# Patient Record
Sex: Male | Born: 1970 | Race: Black or African American | Hispanic: No | Marital: Married | Smoking: Former smoker
Health system: Southern US, Community
[De-identification: ages and names within clinical notes are randomized; demographics above are authoritative.]

## PROBLEM LIST (undated history)

## (undated) DIAGNOSIS — B019 Varicella without complication: Secondary | ICD-10-CM

## (undated) DIAGNOSIS — I10 Essential (primary) hypertension: Secondary | ICD-10-CM

## (undated) DIAGNOSIS — M1990 Unspecified osteoarthritis, unspecified site: Secondary | ICD-10-CM

## (undated) HISTORY — DX: Essential (primary) hypertension: I10

## (undated) HISTORY — PX: KNEE ARTHROSCOPY: SUR90

## (undated) HISTORY — PX: LEG SURGERY: SHX1003

## (undated) HISTORY — DX: Unspecified osteoarthritis, unspecified site: M19.90

## (undated) HISTORY — DX: Varicella without complication: B01.9

---

## 1997-08-05 ENCOUNTER — Emergency Department (HOSPITAL_COMMUNITY): Admission: EM | Admit: 1997-08-05 | Discharge: 1997-08-05 | Payer: Self-pay | Admitting: Emergency Medicine

## 1997-08-19 ENCOUNTER — Emergency Department (HOSPITAL_COMMUNITY): Admission: EM | Admit: 1997-08-19 | Discharge: 1997-08-20 | Payer: Self-pay | Admitting: Internal Medicine

## 2000-05-22 ENCOUNTER — Emergency Department (HOSPITAL_COMMUNITY): Admission: EM | Admit: 2000-05-22 | Discharge: 2000-05-22 | Payer: Self-pay | Admitting: Emergency Medicine

## 2000-06-17 ENCOUNTER — Emergency Department (HOSPITAL_COMMUNITY): Admission: EM | Admit: 2000-06-17 | Discharge: 2000-06-18 | Payer: Self-pay | Admitting: *Deleted

## 2000-06-18 ENCOUNTER — Encounter: Payer: Self-pay | Admitting: *Deleted

## 2000-07-23 ENCOUNTER — Encounter: Payer: Self-pay | Admitting: Emergency Medicine

## 2000-07-23 ENCOUNTER — Emergency Department (HOSPITAL_COMMUNITY): Admission: EM | Admit: 2000-07-23 | Discharge: 2000-07-23 | Payer: Self-pay | Admitting: Emergency Medicine

## 2000-08-06 ENCOUNTER — Encounter: Payer: Self-pay | Admitting: Orthopedic Surgery

## 2000-08-06 ENCOUNTER — Ambulatory Visit (HOSPITAL_COMMUNITY): Admission: RE | Admit: 2000-08-06 | Discharge: 2000-08-06 | Payer: Self-pay | Admitting: Orthopedic Surgery

## 2002-03-22 ENCOUNTER — Emergency Department (HOSPITAL_COMMUNITY): Admission: EM | Admit: 2002-03-22 | Discharge: 2002-03-22 | Payer: Self-pay | Admitting: *Deleted

## 2002-03-22 ENCOUNTER — Encounter: Payer: Self-pay | Admitting: Emergency Medicine

## 2002-11-14 ENCOUNTER — Emergency Department (HOSPITAL_COMMUNITY): Admission: EM | Admit: 2002-11-14 | Discharge: 2002-11-14 | Payer: Self-pay | Admitting: Emergency Medicine

## 2003-01-14 HISTORY — PX: LEG SURGERY: SHX1003

## 2003-08-05 ENCOUNTER — Emergency Department (HOSPITAL_COMMUNITY): Admission: EM | Admit: 2003-08-05 | Discharge: 2003-08-05 | Payer: Self-pay | Admitting: Emergency Medicine

## 2004-09-21 ENCOUNTER — Emergency Department (HOSPITAL_COMMUNITY): Admission: EM | Admit: 2004-09-21 | Discharge: 2004-09-21 | Payer: Self-pay | Admitting: Emergency Medicine

## 2005-03-28 ENCOUNTER — Emergency Department (HOSPITAL_COMMUNITY): Admission: EM | Admit: 2005-03-28 | Discharge: 2005-03-28 | Payer: Self-pay | Admitting: Emergency Medicine

## 2005-10-01 ENCOUNTER — Emergency Department (HOSPITAL_COMMUNITY): Admission: EM | Admit: 2005-10-01 | Discharge: 2005-10-01 | Payer: Self-pay | Admitting: Emergency Medicine

## 2005-10-14 ENCOUNTER — Emergency Department (HOSPITAL_COMMUNITY): Admission: EM | Admit: 2005-10-14 | Discharge: 2005-10-14 | Payer: Self-pay | Admitting: Emergency Medicine

## 2005-10-18 ENCOUNTER — Emergency Department (HOSPITAL_COMMUNITY): Admission: EM | Admit: 2005-10-18 | Discharge: 2005-10-18 | Payer: Self-pay | Admitting: Family Medicine

## 2005-10-19 ENCOUNTER — Emergency Department (HOSPITAL_COMMUNITY): Admission: EM | Admit: 2005-10-19 | Discharge: 2005-10-19 | Payer: Self-pay | Admitting: Family Medicine

## 2007-01-14 HISTORY — PX: KNEE ARTHROSCOPY: SUR90

## 2007-06-19 ENCOUNTER — Emergency Department (HOSPITAL_COMMUNITY): Admission: EM | Admit: 2007-06-19 | Discharge: 2007-06-19 | Payer: Self-pay | Admitting: Emergency Medicine

## 2007-08-02 ENCOUNTER — Emergency Department (HOSPITAL_COMMUNITY): Admission: EM | Admit: 2007-08-02 | Discharge: 2007-08-02 | Payer: Self-pay | Admitting: Emergency Medicine

## 2008-11-19 ENCOUNTER — Emergency Department (HOSPITAL_COMMUNITY): Admission: EM | Admit: 2008-11-19 | Discharge: 2008-11-19 | Payer: Self-pay | Admitting: Emergency Medicine

## 2009-12-04 ENCOUNTER — Emergency Department (HOSPITAL_COMMUNITY): Admission: EM | Admit: 2009-12-04 | Discharge: 2009-12-04 | Payer: Self-pay | Admitting: Family Medicine

## 2010-04-17 LAB — RAPID STREP SCREEN (MED CTR MEBANE ONLY): Streptococcus, Group A Screen (Direct): NEGATIVE

## 2011-04-08 ENCOUNTER — Encounter (HOSPITAL_COMMUNITY): Payer: Self-pay | Admitting: *Deleted

## 2011-04-08 ENCOUNTER — Emergency Department (INDEPENDENT_AMBULATORY_CARE_PROVIDER_SITE_OTHER)
Admission: EM | Admit: 2011-04-08 | Discharge: 2011-04-08 | Disposition: A | Discharge status: Home / Self Care | Payer: BC Managed Care – PPO | Source: Home / Self Care | Attending: Family Medicine | Admitting: Family Medicine

## 2011-04-08 DIAGNOSIS — M25569 Pain in unspecified knee: Secondary | ICD-10-CM

## 2011-04-08 DIAGNOSIS — M25562 Pain in left knee: Secondary | ICD-10-CM

## 2011-04-08 DIAGNOSIS — G8929 Other chronic pain: Secondary | ICD-10-CM

## 2011-04-08 LAB — GLUCOSE, CAPILLARY: Glucose-Capillary: 87 mg/dL (ref 70–99)

## 2011-04-08 MED ORDER — IBUPROFEN 600 MG PO TABS: 600.0000 mg | ORAL_TABLET | Freq: Three times a day (TID) | ORAL | Status: AC | PRN

## 2011-04-08 MED ORDER — TRAMADOL HCL 50 MG PO TABS: 50.0000 mg | ORAL_TABLET | Freq: Four times a day (QID) | ORAL | Status: AC | PRN

## 2011-04-08 NOTE — Discharge Instructions (Signed)
Take the prescribed medications as instructed for chronic knee pain. Combined tramadol with over-the-counter acetaminophen (Tylenol) 500 mg every 6 hours as needed for pain. Followup with your orthopedist as planned. You also need to find a primary care provider for screening of diseases like diabetes cholesterol etc. and to help monitor your efforts in losing weight.  Call number provided above to schedule appointment with a primary care doctor.

## 2011-04-08 NOTE — ED Provider Notes (Signed)
History     CSN: [id number]  Arrival date & time 04/08/11  1645   First MD Initiated Contact with Patient 04/08/11 1656      Chief Complaint  Patient presents with  . Knee Pain    (Consider location/radiation/quality/duration/timing/severity/associated sxs/prior treatment) HPI Comments: 41 year old obese male with history of chronic knee pain. Status post left knee arthroscopy years ago. Reports exacerbation of his knee pain in last 2 months but worse today after helping a friend to move furniture to a new apartment. Has not taken any medications for pain yet. As per his report he cannot see his prior orthopedist as he owes money to his office. Patient states he is in the process to being admitted to a new practice " [facility]". Denies recent falls or any recent blunt trauma to his knee.  Also has had some nasal congestion and felt nauseous with decreased appetite today. No fever or chills. No cough shortness of breath or chest pain. No abdominal pain, vomiting or diarrhea.   History reviewed. No pertinent past medical history.  Past Surgical History  Procedure Date  . Knee arthroscopy   . Leg surgery     Family History  Problem Relation Age of Onset  . Osteoarthritis Father     History  Substance Use Topics  . Smoking status: Never Smoker   . Smokeless tobacco: Not on file  . Alcohol Use: Yes     occasionally      Review of Systems  Constitutional: Negative for fever and chills.  HENT: Positive for congestion and rhinorrhea. Negative for sore throat and trouble swallowing.   Respiratory: Negative for cough, shortness of breath and wheezing.   Cardiovascular: Negative for chest pain and leg swelling.  Gastrointestinal: Positive for nausea. Negative for vomiting and diarrhea.  Musculoskeletal:       Left knee pain as HPI  Skin: Negative for rash.  Neurological: Negative for dizziness and headaches.    Allergies  Review of patient's allergies  indicates no known allergies.  Home Medications   Current Outpatient Rx  Name Route Sig Dispense Refill  . IBUPROFEN 600 MG PO TABS Oral Take 1 tablet (600 mg total) by mouth every 8 (eight) hours as needed for pain. 30 tablet 0  . TRAMADOL HCL 50 MG PO TABS Oral Take 1 tablet (50 mg total) by mouth every 6 (six) hours as needed for pain. 15 tablet 0    BP 146/93  Pulse 110  Temp(Src) 99.5 F (37.5 C) (Oral)  Resp 20  SpO2 97%  Physical Exam  Nursing note and vitals reviewed. Constitutional: He is oriented to person, place, and time. He appears well-developed and well-nourished. No distress.       obese  HENT:  Head: Normocephalic and atraumatic.  Mouth/Throat: Oropharynx is clear and moist. No oropharyngeal exudate.  Eyes: Conjunctivae are normal. Pupils are equal, round, and reactive to light. No scleral icterus.  Neck: Neck supple. No JVD present. No thyromegaly present.  Cardiovascular: Normal rate, regular rhythm and normal heart sounds.   Pulmonary/Chest: Breath sounds normal. No respiratory distress. He has no wheezes. He has no rales. He exhibits no tenderness.  Abdominal: Soft. There is no tenderness.  Musculoskeletal:       Left knee no effusion or deformity. Well healed lateral scar from prior arthroscopy. Crepitus with flexion and extension fair range of movement with reported pain. No patella dislocation. No laxity. Weight bearing but pain with walking.  Lymphadenopathy:  He has no cervical adenopathy.  Neurological: He is alert and oriented to person, place, and time.  Skin: No rash noted. He is not diaphoretic.    ED Course  Procedures (including critical care time)  Labs Reviewed - No data to display No results found.   1. Chronic pain of left knee       MDM  Exacerbation of chronic left knee pain. Prescribed ibuprofen and tramadol. Followup with orthopedist as planned. Patient capillary glucose normal. Mild upper respiratory infection symptoms.  Supportive care discussed with patient.        Sharin Grave, MD 04/10/11 1453

## 2011-04-08 NOTE — ED Notes (Signed)
Bilateral knee pain the past 2 months.  He denies injury.  Today the left knee is more painful than the right , especially while ambulating.

## 2016-07-02 ENCOUNTER — Encounter (HOSPITAL_COMMUNITY): Payer: Self-pay | Admitting: Emergency Medicine

## 2016-07-02 ENCOUNTER — Ambulatory Visit (HOSPITAL_COMMUNITY)
Admission: EM | Admit: 2016-07-02 | Discharge: 2016-07-02 | Disposition: A | Discharge status: Home / Self Care | Payer: Self-pay | Attending: Family Medicine | Admitting: Family Medicine

## 2016-07-02 DIAGNOSIS — R300 Dysuria: Secondary | ICD-10-CM

## 2016-07-02 LAB — POCT URINALYSIS DIP (DEVICE)
Bilirubin Urine: NEGATIVE
Glucose, UA: NEGATIVE mg/dL
KETONES UR: NEGATIVE mg/dL
NITRITE: NEGATIVE
PH: 6 (ref 5.0–8.0)
PROTEIN: 30 mg/dL — AB
Specific Gravity, Urine: 1.025 (ref 1.005–1.030)
UROBILINOGEN UA: 1 mg/dL (ref 0.0–1.0)

## 2016-07-02 MED ORDER — CIPROFLOXACIN HCL 500 MG PO TABS: 500.0000 mg | ORAL_TABLET | Freq: Two times a day (BID) | ORAL | 0 refills | Status: DC

## 2016-07-02 NOTE — ED Triage Notes (Addendum)
Pt reports burning with urination for five days and left lower abdominal pain.  He also reports pain with erection.  Pt is in a monogamous relationship with his wife and is not concerned for STD.

## 2016-07-02 NOTE — ED Provider Notes (Signed)
  [facility]    ASSESSMENT:  1. Dysuria     PLAN:  Meds ordered this encounter  Medications  . ciprofloxacin (CIPRO) 500 MG tablet    Sig: Take 1 tablet (500 mg total) by mouth 2 (two) times daily.    Dispense:  14 tablet    Refill:  0   Discussed possibility of early prostatitis or transient urethral injury. No STD testing desired. Monitor symptoms closely over the next 48-72 hours.  Await urine culture.  Outlined signs and symptoms indicating need for more acute intervention. Call or return to clinic if these symptoms worsen or fail to improve as anticipated. Patient verbalized understanding. After Visit Summary given.  SUBJECTIVE:  Marcus Suarez is a 46 y.o. male who complains of dysuria x 4-5 days ("stinging"), without flank pain, abdominal pain, fever, chills. No testicular pain or swelling. No injury. Initially with drop of blood at urethral meatus. Unsure if further blood. No penile discharge. Sexually active with wife only. No h/o STD. No rashes. Normal PO intake. Ambulatory. Also reports slight and similar pain with ejaculation (once). No h/o similar.  OBJECTIVE:  Vitals:   07/02/16 1007  BP: 131/89  Pulse: 67  Temp: 98.3 F (36.8 C)  TempSrc: Oral  SpO2: 98%   Appears well, in no apparent distress. Penis appears normal without rashes or bleeding. Testicles normal. Prostate with mild tenderness to palpation. Abdomen is soft without tenderness, guarding, mass, rebound or organomegaly. No CVA tenderness or inguinal adenopathy noted.  Labs Reviewed  POCT URINALYSIS DIP (DEVICE) - Abnormal; Notable for the following:       Result Value   Hgb urine dipstick MODERATE (*)    Protein, ur 30 (*)    Leukocytes, UA LARGE (*)    All other components within normal limits  URINE CULTURE      Mardella LaymanHagler, Raywood Wailes, MD 07/02/16 1058

## 2016-07-03 LAB — URINE CULTURE: CULTURE: NO GROWTH

## 2016-07-08 ENCOUNTER — Ambulatory Visit (HOSPITAL_COMMUNITY)
Admission: EM | Admit: 2016-07-08 | Discharge: 2016-07-08 | Disposition: A | Discharge status: Home / Self Care | Payer: Self-pay | Attending: Family Medicine | Admitting: Family Medicine

## 2016-07-08 ENCOUNTER — Encounter (HOSPITAL_COMMUNITY): Payer: Self-pay | Admitting: *Deleted

## 2016-07-08 DIAGNOSIS — R369 Urethral discharge, unspecified: Secondary | ICD-10-CM

## 2016-07-08 NOTE — ED Provider Notes (Signed)
  [facility]   [id number] 07/08/16 Arrival Time: 1039  ASSESSMENT & PLAN:  1. Bloody discharge from penis     No repeat U/A today. He doesn't have insurance until July. Recommend urology f/u. Information given. Will finish Cipro. Informed that urine culture from last visit without growth.  Reviewed expectations re: course of current medical issues. Questions answered. Outlined signs and symptoms indicating need for more acute intervention. Follow up here or in the Emergency Department if worsening. Patient verbalized understanding. After Visit Summary given.   SUBJECTIVE:  Marcus Suarez is a 46 y.o. male who presents for follow up. Since last visit symptoms have improved on Cipro. Stinging sensation with urination has resolved. Approx 48 hours ago though he reports erection and then seeing a drop of bright red blood before ejaculation. "Felt something funny right before." No penile discharge. No further bleeding seen. No abdominal or testicular pain. Urinating normally without visible hematuria.  ROS: As per HPI.   OBJECTIVE:  Vitals:   07/08/16 1113  BP: 132/78  Pulse: 78  Resp: 18  Temp: 98.6 F (37 C)  TempSrc: Oral  SpO2: 100%     General appearance: alert, cooperative, appears stated age and no distress Back: symmetric, no curvature. ROM normal. No CVA tenderness. GU: declines today Abdomen: soft, non-tender; bowel sounds normal; no masses,  no organomegaly; no guarding or rebound tenderness   No Known Allergies  PMHx, SurgHx, SocialHx, Medications, and Allergies were reviewed in the Visit Navigator and updated as appropriate.       Mardella LaymanHagler, Jamilette Suchocki, MD 07/08/16 1154

## 2016-07-08 NOTE — ED Triage Notes (Signed)
Pt   Was   Seen    6    Days   Ago     For   Poss      Std    He  Reports   Several  Days  Ago  He    Noticed    Some  Blood  In  His  Urine      During   Sex        Pt  Reports  He  Got  His  meds  Filled

## 2016-07-08 NOTE — Discharge Instructions (Signed)
Please schedule an appointment with urology for further evaluation.

## 2018-08-09 ENCOUNTER — Other Ambulatory Visit: Payer: Self-pay

## 2018-08-09 DIAGNOSIS — Z20822 Contact with and (suspected) exposure to covid-19: Secondary | ICD-10-CM

## 2018-08-11 LAB — NOVEL CORONAVIRUS, NAA: SARS-CoV-2, NAA: NOT DETECTED

## 2019-02-16 ENCOUNTER — Encounter: Payer: Self-pay | Admitting: Podiatry

## 2019-02-16 ENCOUNTER — Other Ambulatory Visit: Payer: Self-pay

## 2019-02-16 ENCOUNTER — Ambulatory Visit (INDEPENDENT_AMBULATORY_CARE_PROVIDER_SITE_OTHER): Payer: BC Managed Care – PPO

## 2019-02-16 ENCOUNTER — Ambulatory Visit (INDEPENDENT_AMBULATORY_CARE_PROVIDER_SITE_OTHER): Payer: BC Managed Care – PPO | Admitting: Podiatry

## 2019-02-16 VITALS — BP 168/91 | HR 85 | Temp 97.5°F | Resp 16

## 2019-02-16 DIAGNOSIS — M79671 Pain in right foot: Secondary | ICD-10-CM

## 2019-02-16 DIAGNOSIS — M76821 Posterior tibial tendinitis, right leg: Secondary | ICD-10-CM

## 2019-02-16 DIAGNOSIS — M7661 Achilles tendinitis, right leg: Secondary | ICD-10-CM

## 2019-02-16 MED ORDER — DICLOFENAC SODIUM 75 MG PO TBEC: 75.0000 mg | DELAYED_RELEASE_TABLET | Freq: Two times a day (BID) | ORAL | 2 refills | Status: DC

## 2019-02-16 NOTE — Progress Notes (Signed)
   Subjective:    Patient ID: Marcus Suarez, male    DOB: [birth year], 49 y.o.   MRN: [id number]  HPI    Review of Systems  All other systems reviewed and are negative.      Objective:   Physical Exam        Assessment & Plan:

## 2019-02-16 NOTE — Patient Instructions (Signed)

## 2019-02-17 NOTE — Progress Notes (Signed)
Subjective:   Patient ID: Marcus Suarez, male   DOB: 49 y.o.   MRN: [id number]   HPI Patient presents with a lot of pain in the back of his right heel and also in his right ankle stating it is been going on for several months and he has been trying to be active to keep his weight under control.  Patient states this has to curtailed his activity and patient is again trying to get on a weight loss program patient does not smoke likes to be active if possible   Review of Systems  All other systems reviewed and are negative.       Objective:  Physical Exam Vitals and nursing note reviewed.  Constitutional:      Appearance: He is well-developed.  Pulmonary:     Effort: Pulmonary effort is normal.  Musculoskeletal:        General: Normal range of motion.  Skin:    General: Skin is warm.  Neurological:     Mental Status: He is alert.     Neurovascular status was found to be intact muscle strength was found to be adequate range of motion was moderately restricted right secondary to discomfort.  Patient is found to have posterior medial pain Achilles tendon at the insertion into the calcaneus and is noted to have quite a bit of discomfort also in the posterior tibial tendon as it comes under the medial malleolus.  Patient does have moderate equinus condition noted and has good digital perfusion well oriented x3     Assessment:  Acute Achilles tendinitis right with also posterior tibial tendinitis right     Plan:  H&P reviewed both conditions x-rays and flatfoot deformity.  Today we will get a focus on the posterior heel and I did explain risk of rupture associated with injection and patient wants this done.  Today I went ahead did sterile prep of the medial side and carefully injected 3 mg dexamethasone 5 mg Xylocaine keeping away from the center lateral portion of the tendon and then applied air fracture walker to completely immobilize the foot lower leg and also the posterior tibial  tendon.  Patient will be seen back in approximately 4 weeks and was given all instructions at this time  X-rays indicate that there is flatness of the arch and moderate indications of arthritis no indications of stress fracture with posterior heel spur noted

## 2019-02-18 ENCOUNTER — Other Ambulatory Visit: Payer: Self-pay | Admitting: Podiatry

## 2019-02-18 DIAGNOSIS — M7661 Achilles tendinitis, right leg: Secondary | ICD-10-CM

## 2019-02-18 DIAGNOSIS — M76821 Posterior tibial tendinitis, right leg: Secondary | ICD-10-CM

## 2019-03-10 ENCOUNTER — Encounter: Payer: Self-pay | Admitting: Podiatry

## 2019-03-10 ENCOUNTER — Other Ambulatory Visit: Payer: Self-pay

## 2019-03-10 ENCOUNTER — Ambulatory Visit (INDEPENDENT_AMBULATORY_CARE_PROVIDER_SITE_OTHER): Payer: BC Managed Care – PPO | Admitting: Podiatry

## 2019-03-10 VITALS — Temp 97.8°F

## 2019-03-10 DIAGNOSIS — M722 Plantar fascial fibromatosis: Secondary | ICD-10-CM | POA: Diagnosis not present

## 2019-03-10 DIAGNOSIS — M7661 Achilles tendinitis, right leg: Secondary | ICD-10-CM

## 2019-03-11 NOTE — Progress Notes (Signed)
Subjective:   Patient ID: Marcus Suarez, male   DOB: 49 y.o.   MRN: [id number]   HPI Patient presents stating that he has been wearing the boot and it seems the back of the heel is quite a bit improved but still noticeable and has been having some problems also with the bottom of the heel that he has questions about.  States he seems to be improved but still notices discomfort and is been wearing the boot almost exclusively up till now   ROS      Objective:  Physical Exam  Neurovascular status intact with patient found to have diminished posterior pain of the heel with discomfort still noted upon deep palpation but overall improved with patient found to have plantar inflammation of the medial band of the tendon that is moderately tender where pressed     Assessment:  Achilles tendinitis which appears to be improved with combination of patient immobilization with moderate plantar fascial inflammation right     Plan:  Discussed both conditions separately and at this point for the plantar heel I did do sterile prep and injected the fascia 3 mg Kenalog 5 mg Xylocaine and for the posterior I have recommended continued immobilization with heel lift which was dispensed today with gradual increase in activity reduction of boot.  Patient will be seen back to reevaluate

## 2019-04-07 ENCOUNTER — Ambulatory Visit: Payer: BC Managed Care – PPO | Admitting: Podiatry

## 2019-04-28 ENCOUNTER — Other Ambulatory Visit: Payer: Self-pay

## 2019-04-28 ENCOUNTER — Encounter: Payer: Self-pay | Admitting: Podiatry

## 2019-04-28 ENCOUNTER — Ambulatory Visit (INDEPENDENT_AMBULATORY_CARE_PROVIDER_SITE_OTHER): Payer: BC Managed Care – PPO | Admitting: Podiatry

## 2019-04-28 VITALS — Temp 98.2°F

## 2019-04-28 DIAGNOSIS — M722 Plantar fascial fibromatosis: Secondary | ICD-10-CM

## 2019-04-28 DIAGNOSIS — M7661 Achilles tendinitis, right leg: Secondary | ICD-10-CM

## 2019-04-28 NOTE — Progress Notes (Signed)
Subjective:   Patient ID: Marcus Suarez, male   DOB: 49 y.o.   MRN: [id number]   HPI Patient presents stating my foot feels better with occasional zinger type pains in the back of my right heel   ROS      Objective:  Physical Exam  Neuro vascular status intact with plantar pain that has improved with mild to moderate posterior pain which is present but not as bad as previous     Assessment:  Achilles tendinitis right improved but painful with plantar pain improved     Plan:  H&P discussed continued anti-inflammatories physical therapy and boot usage on occasional basis.  Patient is discharged will be seen back as needed

## 2020-11-21 ENCOUNTER — Emergency Department (HOSPITAL_COMMUNITY): Payer: BC Managed Care – PPO | Admitting: Certified Registered Nurse Anesthetist

## 2020-11-21 ENCOUNTER — Encounter (HOSPITAL_COMMUNITY)
Admission: EM | Disposition: A | Discharge status: Home / Self Care | Payer: Self-pay | Source: Home / Self Care | Attending: Emergency Medicine

## 2020-11-21 ENCOUNTER — Observation Stay (HOSPITAL_COMMUNITY)
Admission: EM | Admit: 2020-11-21 | Discharge: 2020-11-22 | Disposition: A | Discharge status: Home / Self Care | Payer: BC Managed Care – PPO | Attending: Emergency Medicine | Admitting: Emergency Medicine

## 2020-11-21 ENCOUNTER — Other Ambulatory Visit: Payer: Self-pay

## 2020-11-21 ENCOUNTER — Emergency Department (HOSPITAL_COMMUNITY): Payer: BC Managed Care – PPO

## 2020-11-21 ENCOUNTER — Ambulatory Visit (INDEPENDENT_AMBULATORY_CARE_PROVIDER_SITE_OTHER)
Admission: EM | Admit: 2020-11-21 | Discharge: 2020-11-21 | Disposition: A | Discharge status: Home / Self Care | Payer: BC Managed Care – PPO | Source: Home / Self Care

## 2020-11-21 ENCOUNTER — Encounter (HOSPITAL_COMMUNITY): Payer: Self-pay

## 2020-11-21 ENCOUNTER — Ambulatory Visit (INDEPENDENT_AMBULATORY_CARE_PROVIDER_SITE_OTHER): Payer: BC Managed Care – PPO

## 2020-11-21 DIAGNOSIS — K5900 Constipation, unspecified: Secondary | ICD-10-CM | POA: Diagnosis not present

## 2020-11-21 DIAGNOSIS — R1012 Left upper quadrant pain: Secondary | ICD-10-CM | POA: Diagnosis not present

## 2020-11-21 DIAGNOSIS — Z6838 Body mass index (BMI) 38.0-38.9, adult: Secondary | ICD-10-CM | POA: Diagnosis not present

## 2020-11-21 DIAGNOSIS — R1032 Left lower quadrant pain: Secondary | ICD-10-CM | POA: Diagnosis not present

## 2020-11-21 DIAGNOSIS — F1290 Cannabis use, unspecified, uncomplicated: Secondary | ICD-10-CM | POA: Diagnosis not present

## 2020-11-21 DIAGNOSIS — R109 Unspecified abdominal pain: Secondary | ICD-10-CM | POA: Diagnosis present

## 2020-11-21 DIAGNOSIS — K3580 Unspecified acute appendicitis: Principal | ICD-10-CM | POA: Diagnosis present

## 2020-11-21 DIAGNOSIS — R1084 Generalized abdominal pain: Secondary | ICD-10-CM

## 2020-11-21 DIAGNOSIS — R1031 Right lower quadrant pain: Secondary | ICD-10-CM

## 2020-11-21 DIAGNOSIS — E669 Obesity, unspecified: Secondary | ICD-10-CM | POA: Diagnosis not present

## 2020-11-21 DIAGNOSIS — Z20822 Contact with and (suspected) exposure to covid-19: Secondary | ICD-10-CM | POA: Insufficient documentation

## 2020-11-21 DIAGNOSIS — R112 Nausea with vomiting, unspecified: Secondary | ICD-10-CM | POA: Insufficient documentation

## 2020-11-21 HISTORY — PX: LAPAROSCOPIC APPENDECTOMY: SHX408

## 2020-11-21 LAB — CBC WITH DIFFERENTIAL/PLATELET
Abs Immature Granulocytes: 0.12 10*3/uL — ABNORMAL HIGH (ref 0.00–0.07)
Abs Immature Granulocytes: 0.12 10*3/uL — ABNORMAL HIGH (ref 0.00–0.07)
Basophils Absolute: 0 10*3/uL (ref 0.0–0.1)
Basophils Absolute: 0 10*3/uL (ref 0.0–0.1)
Basophils Relative: 0 %
Basophils Relative: 0 %
Eosinophils Absolute: 0 10*3/uL (ref 0.0–0.5)
Eosinophils Absolute: 0 10*3/uL (ref 0.0–0.5)
Eosinophils Relative: 0 %
Eosinophils Relative: 0 %
HCT: 47.4 % (ref 39.0–52.0)
HCT: 45.1 % (ref 39.0–52.0)
Hemoglobin: 15.2 g/dL (ref 13.0–17.0)
Hemoglobin: 14.5 g/dL (ref 13.0–17.0)
Immature Granulocytes: 1 %
Immature Granulocytes: 1 %
Lymphocytes Relative: 6 %
Lymphocytes Relative: 6 %
Lymphs Abs: 1.1 10*3/uL (ref 0.7–4.0)
Lymphs Abs: 1.1 10*3/uL (ref 0.7–4.0)
MCH: 28 pg (ref 26.0–34.0)
MCH: 28 pg (ref 26.0–34.0)
MCHC: 32.1 g/dL (ref 30.0–36.0)
MCHC: 32.2 g/dL (ref 30.0–36.0)
MCV: 87.3 fL (ref 80.0–100.0)
MCV: 87.2 fL (ref 80.0–100.0)
Monocytes Absolute: 0.5 10*3/uL (ref 0.1–1.0)
Monocytes Absolute: 0.5 10*3/uL (ref 0.1–1.0)
Monocytes Relative: 3 %
Monocytes Relative: 3 %
Neutro Abs: 17.9 10*3/uL — ABNORMAL HIGH (ref 1.7–7.7)
Neutro Abs: 17.5 10*3/uL — ABNORMAL HIGH (ref 1.7–7.7)
Neutrophils Relative %: 90 %
Neutrophils Relative %: 90 %
Platelets: 259 10*3/uL (ref 150–400)
Platelets: 247 10*3/uL (ref 150–400)
RBC: 5.43 MIL/uL (ref 4.22–5.81)
RBC: 5.17 MIL/uL (ref 4.22–5.81)
RDW: 12.8 % (ref 11.5–15.5)
RDW: 12.9 % (ref 11.5–15.5)
WBC: 19.7 10*3/uL — ABNORMAL HIGH (ref 4.0–10.5)
WBC: 19.4 10*3/uL — ABNORMAL HIGH (ref 4.0–10.5)
nRBC: 0 % (ref 0.0–0.2)
nRBC: 0 % (ref 0.0–0.2)

## 2020-11-21 LAB — URINALYSIS, ROUTINE W REFLEX MICROSCOPIC
Bilirubin Urine: NEGATIVE
Glucose, UA: NEGATIVE mg/dL
Ketones, ur: NEGATIVE mg/dL
Leukocytes,Ua: NEGATIVE
Nitrite: NEGATIVE
Protein, ur: 30 mg/dL — AB
RBC / HPF: >50 RBC/hpf — ABNORMAL HIGH (ref 0–5)
Specific Gravity, Urine: >1.046 — ABNORMAL HIGH (ref 1.005–1.030)
pH: 7 (ref 5.0–8.0)

## 2020-11-21 LAB — COMPREHENSIVE METABOLIC PANEL
ALT: 35 U/L (ref 0–44)
ALT: 34 U/L (ref 0–44)
AST: 25 U/L (ref 15–41)
AST: 25 U/L (ref 15–41)
Albumin: 4.7 g/dL (ref 3.5–5.0)
Albumin: 4.8 g/dL (ref 3.5–5.0)
Alkaline Phosphatase: 85 U/L (ref 38–126)
Alkaline Phosphatase: 80 U/L (ref 38–126)
Anion gap: 11 (ref 5–15)
Anion gap: 8 (ref 5–15)
BUN: 5 mg/dL — ABNORMAL LOW (ref 6–20)
BUN: 6 mg/dL (ref 6–20)
CO2: 25 mmol/L (ref 22–32)
CO2: 26 mmol/L (ref 22–32)
Calcium: 9.2 mg/dL (ref 8.9–10.3)
Calcium: 9 mg/dL (ref 8.9–10.3)
Chloride: 98 mmol/L (ref 98–111)
Chloride: 101 mmol/L (ref 98–111)
Creatinine, Ser: 0.83 mg/dL (ref 0.61–1.24)
Creatinine, Ser: 0.71 mg/dL (ref 0.61–1.24)
GFR, Estimated: >60 mL/min (ref >60)
GFR, Estimated: >60 mL/min (ref >60)
Glucose, Bld: 132 mg/dL — ABNORMAL HIGH (ref 70–99)
Glucose, Bld: 137 mg/dL — ABNORMAL HIGH (ref 70–99)
Potassium: 3.6 mmol/L (ref 3.5–5.1)
Potassium: 3.8 mmol/L (ref 3.5–5.1)
Sodium: 134 mmol/L — ABNORMAL LOW (ref 135–145)
Sodium: 135 mmol/L (ref 135–145)
Total Bilirubin: 1.1 mg/dL (ref 0.3–1.2)
Total Bilirubin: 1.1 mg/dL (ref 0.3–1.2)
Total Protein: 8.4 g/dL — ABNORMAL HIGH (ref 6.5–8.1)
Total Protein: 8.4 g/dL — ABNORMAL HIGH (ref 6.5–8.1)

## 2020-11-21 LAB — RESP PANEL BY RT-PCR (FLU A&B, COVID) ARPGX2
Influenza A by PCR: NEGATIVE
Influenza B by PCR: NEGATIVE
SARS Coronavirus 2 by RT PCR: NEGATIVE

## 2020-11-21 LAB — LIPASE, BLOOD: Lipase: 24 U/L (ref 11–51)

## 2020-11-21 LAB — AMYLASE: Amylase: 28 U/L (ref 28–100)

## 2020-11-21 SURGERY — APPENDECTOMY, LAPAROSCOPIC
Anesthesia: General | Site: Abdomen

## 2020-11-21 MED ORDER — OXYCODONE HCL 5 MG PO TABS: 5.0000 mg | ORAL_TABLET | Freq: Once | ORAL | Status: DC | PRN

## 2020-11-21 MED ORDER — METHOCARBAMOL 500 MG PO TABS
500.0000 mg | ORAL_TABLET | Freq: Four times a day (QID) | ORAL | Status: DC | PRN
  Administered 2020-11-21: 500 mg via ORAL
  Filled 2020-11-21: qty 1

## 2020-11-21 MED ORDER — LIDOCAINE VISCOUS HCL 2 % MT SOLN
OROMUCOSAL | Status: AC
  Filled 2020-11-21: qty 15

## 2020-11-21 MED ORDER — LIDOCAINE VISCOUS HCL 2 % MT SOLN
15.0000 mL | Freq: Once | OROMUCOSAL | Status: AC
  Administered 2020-11-21: 15 mL via ORAL

## 2020-11-21 MED ORDER — ALUM & MAG HYDROXIDE-SIMETH 200-200-20 MG/5ML PO SUSP
30.0000 mL | Freq: Once | ORAL | Status: AC
  Administered 2020-11-21: 30 mL via ORAL

## 2020-11-21 MED ORDER — FENTANYL CITRATE PF 50 MCG/ML IJ SOSY: 25.0000 ug | PREFILLED_SYRINGE | INTRAMUSCULAR | Status: DC | PRN

## 2020-11-21 MED ORDER — SUCCINYLCHOLINE CHLORIDE 200 MG/10ML IV SOSY
PREFILLED_SYRINGE | INTRAVENOUS | Status: AC
  Filled 2020-11-21: qty 10

## 2020-11-21 MED ORDER — ACETAMINOPHEN 500 MG PO TABS
1000.0000 mg | ORAL_TABLET | Freq: Four times a day (QID) | ORAL | Status: DC
  Administered 2020-11-21 – 2020-11-22 (×2): 1000 mg via ORAL
  Filled 2020-11-21 (×2): qty 2

## 2020-11-21 MED ORDER — ROCURONIUM BROMIDE 10 MG/ML (PF) SYRINGE
PREFILLED_SYRINGE | INTRAVENOUS | Status: DC | PRN
  Administered 2020-11-21 (×2): 20 mg via INTRAVENOUS

## 2020-11-21 MED ORDER — ENOXAPARIN SODIUM 40 MG/0.4ML IJ SOSY
40.0000 mg | PREFILLED_SYRINGE | INTRAMUSCULAR | Status: DC
  Administered 2020-11-22: 40 mg via SUBCUTANEOUS
  Filled 2020-11-21: qty 0.4

## 2020-11-21 MED ORDER — OXYCODONE HCL 5 MG PO TABS: 5.0000 mg | ORAL_TABLET | ORAL | Status: DC | PRN

## 2020-11-21 MED ORDER — ONDANSETRON 4 MG PO TBDP: 4.0000 mg | ORAL_TABLET | Freq: Four times a day (QID) | ORAL | Status: DC | PRN

## 2020-11-21 MED ORDER — SODIUM CHLORIDE 0.9 % IV BOLUS
1000.0000 mL | Freq: Once | INTRAVENOUS | Status: AC
  Administered 2020-11-21: 1000 mL via INTRAVENOUS

## 2020-11-21 MED ORDER — ONDANSETRON HCL 4 MG/2ML IJ SOLN
INTRAMUSCULAR | Status: DC | PRN
  Administered 2020-11-21: 4 mg via INTRAVENOUS

## 2020-11-21 MED ORDER — MIDAZOLAM HCL 2 MG/2ML IJ SOLN
INTRAMUSCULAR | Status: AC
  Filled 2020-11-21: qty 2

## 2020-11-21 MED ORDER — BUPIVACAINE HCL (PF) 0.5 % IJ SOLN
INTRAMUSCULAR | Status: DC | PRN
  Administered 2020-11-21: 20 mL

## 2020-11-21 MED ORDER — PROPOFOL 10 MG/ML IV BOLUS
INTRAVENOUS | Status: DC | PRN
  Administered 2020-11-21: 200 mg via INTRAVENOUS

## 2020-11-21 MED ORDER — ONDANSETRON 4 MG PO TBDP
ORAL_TABLET | ORAL | Status: AC
  Filled 2020-11-21: qty 1

## 2020-11-21 MED ORDER — LIDOCAINE 2% (20 MG/ML) 5 ML SYRINGE
INTRAMUSCULAR | Status: DC | PRN
Start: 2020-11-21 — End: 2020-11-21
  Administered 2020-11-21: 60 mg via INTRAVENOUS

## 2020-11-21 MED ORDER — ALUM & MAG HYDROXIDE-SIMETH 200-200-20 MG/5ML PO SUSP
ORAL | Status: AC
  Filled 2020-11-21: qty 30

## 2020-11-21 MED ORDER — IOHEXOL 350 MG/ML SOLN
80.0000 mL | Freq: Once | INTRAVENOUS | Status: AC | PRN
  Administered 2020-11-21: 80 mL via INTRAVENOUS

## 2020-11-21 MED ORDER — DEXMEDETOMIDINE (PRECEDEX) IN NS 20 MCG/5ML (4 MCG/ML) IV SYRINGE
PREFILLED_SYRINGE | INTRAVENOUS | Status: AC
  Filled 2020-11-21: qty 5

## 2020-11-21 MED ORDER — SUGAMMADEX SODIUM 500 MG/5ML IV SOLN
INTRAVENOUS | Status: DC | PRN
  Administered 2020-11-21: 200 mg via INTRAVENOUS

## 2020-11-21 MED ORDER — DIPHENHYDRAMINE HCL 25 MG PO CAPS: 25.0000 mg | ORAL_CAPSULE | Freq: Four times a day (QID) | ORAL | Status: DC | PRN

## 2020-11-21 MED ORDER — ONDANSETRON HCL 4 MG/2ML IJ SOLN
4.0000 mg | Freq: Once | INTRAMUSCULAR | Status: AC
  Administered 2020-11-21: 4 mg via INTRAVENOUS
  Filled 2020-11-21: qty 2

## 2020-11-21 MED ORDER — PIPERACILLIN-TAZOBACTAM IN DEX 2-0.25 GM/50ML IV SOLN
4.5000 g | Freq: Once | INTRAVENOUS | Status: AC
  Administered 2020-11-21: 4.5 g via INTRAVENOUS
  Filled 2020-11-21 (×2): qty 100

## 2020-11-21 MED ORDER — ACETAMINOPHEN 10 MG/ML IV SOLN: 1000.0000 mg | Freq: Once | INTRAVENOUS | Status: DC | PRN

## 2020-11-21 MED ORDER — SUCCINYLCHOLINE CHLORIDE 200 MG/10ML IV SOSY
PREFILLED_SYRINGE | INTRAVENOUS | Status: DC | PRN
  Administered 2020-11-21: 120 mg via INTRAVENOUS

## 2020-11-21 MED ORDER — ONDANSETRON 4 MG PO TBDP
4.0000 mg | ORAL_TABLET | Freq: Once | ORAL | Status: AC
  Administered 2020-11-21: 4 mg via ORAL

## 2020-11-21 MED ORDER — DIPHENHYDRAMINE HCL 50 MG/ML IJ SOLN: 25.0000 mg | Freq: Four times a day (QID) | INTRAMUSCULAR | Status: DC | PRN

## 2020-11-21 MED ORDER — OXYCODONE HCL 5 MG/5ML PO SOLN: 5.0000 mg | Freq: Once | ORAL | Status: DC | PRN

## 2020-11-21 MED ORDER — LACTATED RINGERS IV SOLN
INTRAVENOUS | Status: AC | PRN
  Administered 2020-11-21: 1000 mL

## 2020-11-21 MED ORDER — MORPHINE SULFATE (PF) 4 MG/ML IV SOLN
4.0000 mg | Freq: Once | INTRAVENOUS | Status: AC
  Administered 2020-11-21: 4 mg via INTRAVENOUS
  Filled 2020-11-21: qty 1

## 2020-11-21 MED ORDER — POTASSIUM CHLORIDE IN NACL 20-0.9 MEQ/L-% IV SOLN: INTRAVENOUS | Status: DC

## 2020-11-21 MED ORDER — DEXMEDETOMIDINE (PRECEDEX) IN NS 20 MCG/5ML (4 MCG/ML) IV SYRINGE
PREFILLED_SYRINGE | INTRAVENOUS | Status: DC | PRN
  Administered 2020-11-21: 8 ug via INTRAVENOUS

## 2020-11-21 MED ORDER — FENTANYL CITRATE (PF) 250 MCG/5ML IJ SOLN
INTRAMUSCULAR | Status: DC | PRN
  Administered 2020-11-21: 50 ug via INTRAVENOUS
  Administered 2020-11-21: 150 ug via INTRAVENOUS

## 2020-11-21 MED ORDER — ONDANSETRON HCL 4 MG/2ML IJ SOLN: 4.0000 mg | Freq: Four times a day (QID) | INTRAMUSCULAR | Status: DC | PRN

## 2020-11-21 MED ORDER — FENTANYL CITRATE (PF) 250 MCG/5ML IJ SOLN
INTRAMUSCULAR | Status: AC
  Filled 2020-11-21: qty 5

## 2020-11-21 MED ORDER — BUPIVACAINE HCL (PF) 0.5 % IJ SOLN
INTRAMUSCULAR | Status: AC
  Filled 2020-11-21: qty 30

## 2020-11-21 MED ORDER — AMISULPRIDE (ANTIEMETIC) 5 MG/2ML IV SOLN: 10.0000 mg | Freq: Once | INTRAVENOUS | Status: DC | PRN

## 2020-11-21 MED ORDER — PROMETHAZINE HCL 25 MG/ML IJ SOLN: 6.2500 mg | INTRAMUSCULAR | Status: DC | PRN

## 2020-11-21 MED ORDER — SODIUM CHLORIDE 0.9 % IV SOLN: INTRAVENOUS | Status: DC

## 2020-11-21 MED ORDER — KETOROLAC TROMETHAMINE 30 MG/ML IJ SOLN: 30.0000 mg | Freq: Once | INTRAMUSCULAR | Status: DC

## 2020-11-21 MED ORDER — MIDAZOLAM HCL 5 MG/5ML IJ SOLN
INTRAMUSCULAR | Status: DC | PRN
  Administered 2020-11-21: 2 mg via INTRAVENOUS

## 2020-11-21 MED ORDER — LACTATED RINGERS IV SOLN: INTRAVENOUS | Status: DC | PRN

## 2020-11-21 MED ORDER — TRAMADOL HCL 50 MG PO TABS
50.0000 mg | ORAL_TABLET | Freq: Four times a day (QID) | ORAL | Status: DC | PRN
  Administered 2020-11-22: 50 mg via ORAL
  Filled 2020-11-21: qty 1

## 2020-11-21 MED ORDER — HYDROMORPHONE HCL 1 MG/ML IJ SOLN
1.0000 mg | INTRAMUSCULAR | Status: DC | PRN
  Administered 2020-11-22: 1 mg via INTRAVENOUS
  Filled 2020-11-21: qty 1

## 2020-11-21 MED ORDER — PHENYLEPHRINE 40 MCG/ML (10ML) SYRINGE FOR IV PUSH (FOR BLOOD PRESSURE SUPPORT)
PREFILLED_SYRINGE | INTRAVENOUS | Status: DC | PRN
  Administered 2020-11-21: 80 ug via INTRAVENOUS

## 2020-11-21 SURGICAL SUPPLY — 39 items
ADH SKN CLS APL DERMABOND .7 (GAUZE/BANDAGES/DRESSINGS) ×1
APL PRP STRL LF DISP 70% ISPRP (MISCELLANEOUS) ×1
APPLIER CLIP 5 13 M/L LIGAMAX5 (MISCELLANEOUS)
APR CLP MED LRG 5 ANG JAW (MISCELLANEOUS)
BAG COUNTER SPONGE SURGICOUNT (BAG) IMPLANT
BAG SPEC RTRVL LRG 6X4 10 (ENDOMECHANICALS) ×1
BAG SPNG CNTER NS LX DISP (BAG)
CHLORAPREP W/TINT 26 (MISCELLANEOUS) ×2 IMPLANT
CLIP APPLIE 5 13 M/L LIGAMAX5 (MISCELLANEOUS) IMPLANT
COVER SURGICAL LIGHT HANDLE (MISCELLANEOUS) ×2 IMPLANT
CUTTER FLEX LINEAR 45M (STAPLE) IMPLANT
DECANTER SPIKE VIAL GLASS SM (MISCELLANEOUS) ×2 IMPLANT
DERMABOND ADVANCED (GAUZE/BANDAGES/DRESSINGS) ×1
DERMABOND ADVANCED .7 DNX12 (GAUZE/BANDAGES/DRESSINGS) ×1 IMPLANT
DRAPE LAPAROSCOPIC ABDOMINAL (DRAPES) ×2 IMPLANT
ELECT COAG MONOPOLAR (ELECTROSURGICAL) ×2
ELECT REM PT RETURN 15FT ADLT (MISCELLANEOUS) ×2 IMPLANT
ELECTRODE COAG MONOPOLAR (ELECTROSURGICAL) ×1 IMPLANT
GLOVE SURG ENC MOIS LTX SZ7.5 (GLOVE) ×2 IMPLANT
GOWN STRL REUS W/TWL XL LVL3 (GOWN DISPOSABLE) ×4 IMPLANT
IRRIG SUCT STRYKERFLOW 2 WTIP (MISCELLANEOUS) ×2
IRRIGATION SUCT STRKRFLW 2 WTP (MISCELLANEOUS) ×1 IMPLANT
KIT BASIN OR (CUSTOM PROCEDURE TRAY) ×2 IMPLANT
KIT TURNOVER KIT A (KITS) IMPLANT
PENCIL SMOKE EVACUATOR (MISCELLANEOUS) IMPLANT
POUCH SPECIMEN RETRIEVAL 10MM (ENDOMECHANICALS) ×2 IMPLANT
RELOAD 45 VASCULAR/THIN (ENDOMECHANICALS) IMPLANT
RELOAD STAPLE 45 3.5 BLU ETS (ENDOMECHANICALS) IMPLANT
RELOAD STAPLE TA45 3.5 REG BLU (ENDOMECHANICALS) ×2 IMPLANT
SET TUBE SMOKE EVAC HIGH FLOW (TUBING) ×2 IMPLANT
SHEARS HARMONIC ACE PLUS 36CM (ENDOMECHANICALS) ×2 IMPLANT
SLEEVE XCEL OPT CAN 5 100 (ENDOMECHANICALS) ×2 IMPLANT
SUT MNCRL AB 4-0 PS2 18 (SUTURE) ×2 IMPLANT
TOWEL OR 17X26 10 PK STRL BLUE (TOWEL DISPOSABLE) ×2 IMPLANT
TOWEL OR NON WOVEN STRL DISP B (DISPOSABLE) ×2 IMPLANT
TRAY FOLEY MTR SLVR 16FR STAT (SET/KITS/TRAYS/PACK) ×1 IMPLANT
TRAY LAPAROSCOPIC (CUSTOM PROCEDURE TRAY) ×2 IMPLANT
TROCAR BLADELESS OPT 5 100 (ENDOMECHANICALS) ×2 IMPLANT
TROCAR XCEL BLUNT TIP 100MML (ENDOMECHANICALS) ×2 IMPLANT

## 2020-11-21 NOTE — ED Provider Notes (Signed)
Emergency Medicine Provider Triage Evaluation Note  Marcus Suarez , a 50 y.o. male  was evaluated in triage.  Pt complains of abdominal pain.  Began yesterday.  Was seen at urgent care.  Had elevated white count, sent here for further evaluation.  Pain located generalized mid abdomen.  No chest pain, shortness of breath, back pain, urinary complaints, diarrhea or emesis.  He did eat [place] food prior to his pain starting.  Rates his pain a 10/10.  Review of Systems  Positive: Abdominal pain Negative: Fever, emesis, diarrhea  Physical Exam  BP (!) 194/86 (BP Location: Right Wrist)   Pulse 76   Temp 98.1 F (36.7 C) (Oral)   Resp 20   Ht 6' 2.5" (1.892 m)   Wt 136.1 kg   SpO2 100%   BMI 38.00 kg/m  Gen:   Awake, no distress   Resp:  Normal effort  ABD:  Nontender, difficult exam due to body habitus MSK:   Moves extremities without difficulty  Other:    Medical Decision Making  Medically screening exam initiated at 1:17 PM.  Appropriate orders placed.  CONSTANTIN HILLERY was informed that the remainder of the evaluation will be completed by another provider, this initial triage assessment does not replace that evaluation, and the importance of remaining in the ED until their evaluation is complete.  Abdominal pain  Reviewed labs from urgent care, WBC at 19  Plan on imaging   Phill Steck A, PA-C 11/21/20 1320    Horton, Clabe Seal, DO 11/21/20 1436

## 2020-11-21 NOTE — ED Triage Notes (Signed)
Pt presents with c/o abdominal pain. Pt reports he went to UC and was sent here for a CT.

## 2020-11-21 NOTE — Anesthesia Preprocedure Evaluation (Addendum)
Anesthesia Evaluation  Patient identified by MRN, date of birth, ID band Patient awake    Reviewed: Allergy & Precautions, NPO status , Patient's Chart, lab work & pertinent test results  Airway Mallampati: II  TM Distance: >3 FB Neck ROM: Full    Dental  (+) Edentulous Upper, Missing, Loose,    Pulmonary neg pulmonary ROS,    Pulmonary exam normal breath sounds clear to auscultation       Cardiovascular negative cardio ROS Normal cardiovascular exam Rhythm:Regular Rate:Normal  ECG: NSR, rate 71   Neuro/Psych negative neurological ROS  negative psych ROS   GI/Hepatic negative GI ROS, (+)     substance abuse  marijuana use,   Endo/Other  negative endocrine ROS  Renal/GU negative Renal ROS     Musculoskeletal negative musculoskeletal ROS (+)   Abdominal (+) + obese,   Peds  Hematology negative hematology ROS (+)   Anesthesia Other Findings ACUTE APPENDICITIS  Reproductive/Obstetrics                            Anesthesia Physical Anesthesia Plan  ASA: 2 and emergent  Anesthesia Plan: General   Post-op Pain Management:    Induction: Intravenous  PONV Risk Score and Plan: 3 and Ondansetron, Dexamethasone, Midazolam and Treatment may vary due to age or medical condition  Airway Management Planned: Oral ETT  Additional Equipment:   Intra-op Plan:   Post-operative Plan: Extubation in OR  Informed Consent: I have reviewed the patients History and Physical, chart, labs and discussed the procedure including the risks, benefits and alternatives for the proposed anesthesia with the patient or authorized representative who has indicated his/her understanding and acceptance.     Dental advisory given  Plan Discussed with: CRNA  Anesthesia Plan Comments:        Anesthesia Quick Evaluation

## 2020-11-21 NOTE — ED Provider Notes (Signed)
[facility]    CSN: [id number] Arrival date & time: 11/21/20  0859      History   Chief Complaint Chief Complaint  Patient presents with   Abdominal Pain    HPI Marcus Suarez is a 50 y.o. male.   HPI  Abdominal Pain: Patient reports that since last night he has had abdominal pain.  Symptoms started after he ate at a [employer].  He states that he had 2 tacos at [employer] and took an ACP to go which he ate most of.  He states that his pain started off his epigastric in nature and then trended to the left side and now has trended downward.  He reports that the first time he vomited he self-inflicted as he thought that vomiting up the food would help relieve his pain.  He has vomited 2 times since then.  Vomit appears to be last meal and does not appear bloody or coffee like in nature.  He denies any diarrhea.  Last bowel movement was yesterday but he does think that he has had some recent constipation.  He denies any fever, current chest pain, jaundice.  He has tried Pepto-Bismol for symptoms without much relief.  History reviewed. No pertinent past medical history.  There are no problems to display for this patient.   Past Surgical History:  Procedure Laterality Date   KNEE ARTHROSCOPY     LEG SURGERY         Home Medications    Prior to Admission medications   Medication Sig Start Date End Date Taking? Authorizing Provider  diclofenac (VOLTAREN) 75 MG EC tablet Take 1 tablet (75 mg total) by mouth 2 (two) times daily. 02/16/19   Lenn Sink, DPM    Family History Family History  Problem Relation Age of Onset   Osteoarthritis Father     Social History Social History   Tobacco Use   Smoking status: Never   Smokeless tobacco: Never  Substance Use Topics   Alcohol use: Yes    Comment: occasionally   Drug use: Yes    Types: Marijuana     Allergies   Patient has no known allergies.   Review of Systems Review of Systems  As  stated above in HPI Physical Exam Triage Vital Signs ED Triage Vitals  Enc Vitals Group     BP 11/21/20 1105 (!) 181/96     Pulse Rate 11/21/20 1105 76     Resp 11/21/20 1105 (!) 24     Temp 11/21/20 1105 98.2 F (36.8 C)     Temp Source 11/21/20 1105 Oral     SpO2 11/21/20 1105 100 %     Weight --      Height --      Head Circumference --      Peak Flow --      Pain Score 11/21/20 1059 10     Pain Loc --      Pain Edu? --      Excl. in GC? --    No data found.  Updated Vital Signs BP (!) 181/96 (BP Location: Left Arm)   Pulse 76   Temp 98.2 F (36.8 C) (Oral)   Resp (!) 24   SpO2 100%   Physical Exam Vitals and nursing note reviewed.  Constitutional:      General: He is not in acute distress.    Appearance: He is well-developed. He is obese. He is not ill-appearing, toxic-appearing or diaphoretic.  HENT:     Head: Normocephalic.  Eyes:     General: No scleral icterus.    Extraocular Movements: Extraocular movements intact.     Pupils: Pupils are equal, round, and reactive to light.  Cardiovascular:     Rate and Rhythm: Normal rate and regular rhythm.     Heart sounds: Normal heart sounds.  Pulmonary:     Effort: Pulmonary effort is normal.     Breath sounds: Normal breath sounds.  Abdominal:     General: Abdomen is flat. Bowel sounds are normal.     Palpations: Abdomen is soft. There is no hepatomegaly, splenomegaly or mass.     Tenderness: There is generalized abdominal tenderness and tenderness in the right lower quadrant, epigastric area, left upper quadrant and left lower quadrant. There is guarding. There is no rebound. Negative signs include Murphy's sign, McBurney's sign, psoas sign and obturator sign.     Hernia: No hernia is present.  Skin:    General: Skin is warm.     Coloration: Skin is not cyanotic or jaundiced.  Neurological:     Mental Status: He is alert and oriented to person, place, and time.     UC Treatments / Results  Labs (all labs  ordered are listed, but only abnormal results are displayed) Labs Reviewed  CBC WITH DIFFERENTIAL/PLATELET  COMPREHENSIVE METABOLIC PANEL  LIPASE, BLOOD  AMYLASE    EKG   Radiology No results found.  Procedures Procedures (including critical care time)  Medications Ordered in UC Medications  ondansetron (ZOFRAN-ODT) disintegrating tablet 4 mg (has no administration in time range)  alum & mag hydroxide-simeth (MAALOX/MYLANTA) 200-200-20 MG/5ML suspension 30 mL (has no administration in time range)    And  lidocaine (XYLOCAINE) 2 % viscous mouth solution 15 mL (has no administration in time range)    Initial Impression / Assessment and Plan / UC Course  I have reviewed the triage vital signs and the nursing notes.  Pertinent labs & imaging results that were available during my care of the patient were reviewed by me and considered in my medical decision making (see chart for details).     New. EKG reassuring, Labs and KUB pending.   UPDATE: CBC shows significantly elevated WBC count- high likelihood of him having a surgical abdomen- he therefore will need CT imaging to rule out diverticulitis vs pancreatitis vs obstruction vs appendicitis. Discussed my concerns with patient and have recommended that he go to the ER. He elects private vehicle transfer to ED. He will ne NPO which we discussed.  Final Clinical Impressions(s) / UC Diagnoses   Final diagnoses:  None   Discharge Instructions   None    ED Prescriptions   None    PDMP not reviewed this encounter.   Rushie Chestnut, [place] 11/21/20 1242

## 2020-11-21 NOTE — ED Triage Notes (Signed)
States he started epigastric ain and pain moved to the left upper quadrant around 3 am today. States he vomited 3 time this morning. Sattes he felt a mild chest pain

## 2020-11-21 NOTE — Op Note (Signed)
Appendectomy, Lap, Procedure Note  Indications: The patient presented with a history of right-sided abdominal pain. A CT revealed findings consistent with acute appendicitis.  Pre-operative Diagnosis: acute appendicitis  Post-operative Diagnosis: Same  Surgeon: Abigail Miyamoto   Assistants: 0  Anesthesia: General endotracheal anesthesia  ASA Class: 2  Procedure Details  The patient was seen again in the Holding Room. The risks, benefits, complications, treatment options, and expected outcomes were discussed with the patient and/or family. The possibilities of reaction to medication, perforation of viscus, bleeding, recurrent infection, finding a normal appendix, the need for additional procedures, failure to diagnose a condition, and creating a complication requiring transfusion or operation were discussed. There was concurrence with the proposed plan and informed consent was obtained. The site of surgery was properly noted. The patient was taken to Operating Room, identified as Marcus Suarez and the procedure verified as Appendectomy. A Time Out was held and the above information confirmed.  The patient was placed in the supine position and general anesthesia was induced, along with placement of orogastric tube, Venodyne boots, and a Foley catheter. The abdomen was prepped and draped in a sterile fashion. A one centimeter infraumbilical incision was made.  The  midline fascia was incised with a #15 blade.  A Kelly clamp was used to confirm entrance into the peritoneal cavity.  A pursestring suture was passed around the incision with a 0 Vicryl.  The Hasson was introduced into the abdomen and the tails of the suture were used to hold the Hasson in place.   The pneumoperitoneum was then established to steady pressure of 15 mmHg.  Additional 5 mm cannulas then placed in the left lower quadrant of the abdomen and the right upper quadrant under direct visualization. A careful evaluation of the  entire abdomen was carried out. The patient was placed in Trendelenburg and left lateral decubitus position. The small intestines were retracted in the cephalad and left lateral direction away from the pelvis and right lower quadrant. The patient was found to have an enlarged and inflamed appendix that was extending into the pelvis. There was no evidence of perforation.  The appendix was carefully dissected. The appendix was was skeletonized with the harmonic scalpel.   The appendix was divided at its base using an endo-GIA stapler. Minimal appendiceal stump was left in place. There was no evidence of bleeding, leakage, or complication after division of the appendix. Irrigation was also performed and irrigate suctioned from the abdomen as well.  The umbilical port site was closed with the purse string suture. There was no residual palpable fascial defect.  The trocar site skin wounds were closed with 4-0 Monocryl.  Instrument, sponge, and needle counts were correct at the conclusion of the case.   Findings: The appendix was found to be inflamed. There were not signs of necrosis.  There was not perforation. There was not abscess formation.  Estimated Blood Loss:  Minimal                 Complications:  None; patient tolerated the procedure well.         Disposition: PACU - hemodynamically stable.         Condition: stable

## 2020-11-21 NOTE — H&P (Signed)
CC: Abdominal pain   HPI: Marcus Suarez is an 50 y.o. male who presented to the emergency department with abdominal pain.  The pain started yesterday and was general throughout the entire abdomen.  He was seen in the urgent care and found of elevated white blood count.  He then presented to the emergency department.  He has had nausea and vomiting.  He describes the pain as sharp and severe.  It is now in the right lower quadrant.  He is otherwise without complaints.  He underwent a CT scan showing findings consistent with acute appendicitis.  He has no cardiopulmonary complaints.  He denies fevers or chills.  History reviewed. No pertinent past medical history.  Past Surgical History:  Procedure Laterality Date   KNEE ARTHROSCOPY     LEG SURGERY      Family History  Problem Relation Age of Onset   Osteoarthritis Father     Social:  reports that he has never smoked. He has never used smokeless tobacco. He reports current alcohol use. He reports current drug use. Drug: Marijuana.  Allergies: No Known Allergies  Medications: I have reviewed the patient's current medications.  Results for orders placed or performed during the hospital encounter of 11/21/20 (from the past 48 hour(s))  CBC with Differential     Status: Abnormal (Preliminary result)   Collection Time: 11/21/20  1:26 PM  Result Value Ref Range   WBC 19.4 (H) 4.0 - 10.5 K/uL   RBC 5.17 4.22 - 5.81 MIL/uL   Hemoglobin 14.5 13.0 - 17.0 g/dL   HCT 45.1 39.0 - 52.0 %   MCV 87.2 80.0 - 100.0 fL   MCH 28.0 26.0 - 34.0 pg   MCHC 32.2 30.0 - 36.0 g/dL   RDW 12.9 11.5 - 15.5 %   Platelets 247 150 - 400 K/uL   nRBC 0.0 0.0 - 0.2 %    Comment: Performed at [facility], [place] [address], [place], [place] [address]   Neutrophils Relative % PENDING %   Neutro Abs PENDING 1.7 - 7.7 K/uL   Band Neutrophils PENDING %   Lymphocytes Relative PENDING %   Lymphs Abs PENDING 0.7 - 4.0 K/uL   Monocytes Relative  PENDING %   Monocytes Absolute PENDING 0.1 - 1.0 K/uL   Eosinophils Relative PENDING %   Eosinophils Absolute PENDING 0.0 - 0.5 K/uL   Basophils Relative PENDING %   Basophils Absolute PENDING 0.0 - 0.1 K/uL   WBC Morphology PENDING    RBC Morphology PENDING    Smear Review PENDING    Other PENDING %   nRBC PENDING 0 /100 WBC   Metamyelocytes Relative PENDING %   Myelocytes PENDING %   Promyelocytes Relative PENDING %   Blasts PENDING %   Immature Granulocytes PENDING %   Abs Immature Granulocytes PENDING 0.00 - 0.07 K/uL  Comprehensive metabolic panel     Status: Abnormal   Collection Time: 11/21/20  1:26 PM  Result Value Ref Range   Sodium 135 135 - 145 mmol/L   Potassium 3.8 3.5 - 5.1 mmol/L   Chloride 101 98 - 111 mmol/L   CO2 26 22 - 32 mmol/L   Glucose, Bld 137 (H) 70 - 99 mg/dL    Comment: Glucose reference range applies only to samples taken after fasting for at least 8 hours.   BUN 6 6 - 20 mg/dL   Creatinine, Ser 0.71 0.61 - 1.24 mg/dL   Calcium 9.0 8.9 -  10.3 mg/dL   Total Protein 8.4 (H) 6.5 - 8.1 g/dL   Albumin 4.8 3.5 - 5.0 g/dL   AST 25 15 - 41 U/L   ALT 34 0 - 44 U/L   Alkaline Phosphatase 80 38 - 126 U/L   Total Bilirubin 1.1 0.3 - 1.2 mg/dL   GFR, Estimated >60 >60 mL/min    Comment: (NOTE) Calculated using the CKD-EPI Creatinine Equation (2021)    Anion gap 8 5 - 15    Comment: Performed at [facility], [place] [address] [place], [place] [address]    DG Abdomen 1 View  Result Date: 11/21/2020 CLINICAL DATA:  Constipation EXAM: ABDOMEN - 1 VIEW COMPARISON:  None. FINDINGS: Moderate amount of stool throughout the colon. No focal consolidation. No pleural effusion or pneumothorax. Heart and mediastinal contours are unremarkable. No acute osseous abnormality. IMPRESSION: Moderate amount of stool throughout the colon. Electronically Signed   By: Kathreen Devoid M.D.   On: 11/21/2020 11:52   CT ABDOMEN PELVIS W CONTRAST  Result Date:  11/21/2020 CLINICAL DATA:  Acute nonlocalized abdominal pain. EXAM: CT ABDOMEN AND PELVIS WITH CONTRAST TECHNIQUE: Multidetector CT imaging of the abdomen and pelvis was performed using the standard protocol following bolus administration of intravenous contrast. CONTRAST:  7mL OMNIPAQUE IOHEXOL 350 MG/ML SOLN COMPARISON:  None. FINDINGS: Lower chest: Hypoventilatory change in the dependent lungs. Hepatobiliary: Diffuse hepatic steatosis with focal fatty sparing along the gallbladder fossa. Gallbladder is unremarkable. No biliary ductal dilation. Pancreas: No pancreatic ductal dilation or evidence of acute inflammation. Spleen: Within normal limits. Adrenals/Urinary Tract: Bilateral adrenal glands are unremarkable. No hydronephrosis. No solid enhancing renal mass. Urinary bladder is unremarkable for degree of distension. Stomach/Bowel: Layering hyperdense material in the stomach likely reflects ingested contents. No pathologic dilation of large or small bowel. Inflamed dilated fluid and gas-filled appendix is located in the right hemipelvis measuring 17 mm in diameter with adjacent inflammatory stranding. Vascular/Lymphatic: No abdominal aortic aneurysm. No pathologically enlarged abdominal or pelvic lymph nodes. Reproductive: Prostate is unremarkable. Other: No walled off fluid collections.  No pneumoperitoneum. Musculoskeletal: Mild thoracolumbar spondylosis. Degenerative changes of the bilateral hips. No acute osseous abnormality. IMPRESSION: 1. Acute uncomplicated appendicitis. 2. Diffuse hepatic steatosis. Electronically Signed   By: Dahlia Bailiff M.D.   On: 11/21/2020 16:38    ROS - all of the below systems have been reviewed with the patient and positives are indicated with bold text General: chills, fever or night sweats Eyes: blurry vision or double vision ENT: epistaxis or sore throat Allergy/Immunology: itchy/watery eyes or nasal congestion Hematologic/Lymphatic: bleeding problems, blood clots or  swollen lymph nodes Endocrine: temperature intolerance or unexpected weight changes Breast: new or changing breast lumps or nipple discharge Resp: cough, shortness of breath, or wheezing CV: chest pain or dyspnea on exertion GI: as per HPI GU: dysuria, trouble voiding, or hematuria MSK: joint pain or joint stiffness Neuro: TIA or stroke symptoms Derm: pruritus and skin lesion changes Psych: anxiety and depression  PE Blood pressure (!) 161/91, pulse (!) 101, temperature 98.1 F (36.7 C), temperature source Oral, resp. rate 18, height 6' 2.5" (1.892 m), weight 136.1 kg, SpO2 99 %. Constitutional: NAD; conversant; no deformities Eyes: Moist conjunctiva; no lid lag; anicteric; PERRL Neck: Trachea midline; no thyromegaly Lungs: Normal respiratory effort; no tactile fremitus CV: RRR; no palpable thrills; no pitting edema GI: Abd soft obese with tenderness and guarding in the right lower quadrant; no palpable hepatosplenomegaly MSK: Normal range of motion of extremities; no  clubbing/cyanosis Psychiatric: Appropriate affect; alert and oriented x3 Lymphatic: No palpable cervical or axillary lymphadenopathy  Results for orders placed or performed during the hospital encounter of 11/21/20 (from the past 48 hour(s))  CBC with Differential     Status: Abnormal (Preliminary result)   Collection Time: 11/21/20  1:26 PM  Result Value Ref Range   WBC 19.4 (H) 4.0 - 10.5 K/uL   RBC 5.17 4.22 - 5.81 MIL/uL   Hemoglobin 14.5 13.0 - 17.0 g/dL   HCT 45.1 39.0 - 52.0 %   MCV 87.2 80.0 - 100.0 fL   MCH 28.0 26.0 - 34.0 pg   MCHC 32.2 30.0 - 36.0 g/dL   RDW 12.9 11.5 - 15.5 %   Platelets 247 150 - 400 K/uL   nRBC 0.0 0.0 - 0.2 %    Comment: Performed at [facility], [place] [address] [place], [place] [address]   Neutrophils Relative % PENDING %   Neutro Abs PENDING 1.7 - 7.7 K/uL   Band Neutrophils PENDING %   Lymphocytes Relative PENDING %   Lymphs Abs PENDING 0.7 - 4.0 K/uL    Monocytes Relative PENDING %   Monocytes Absolute PENDING 0.1 - 1.0 K/uL   Eosinophils Relative PENDING %   Eosinophils Absolute PENDING 0.0 - 0.5 K/uL   Basophils Relative PENDING %   Basophils Absolute PENDING 0.0 - 0.1 K/uL   WBC Morphology PENDING    RBC Morphology PENDING    Smear Review PENDING    Other PENDING %   nRBC PENDING 0 /100 WBC   Metamyelocytes Relative PENDING %   Myelocytes PENDING %   Promyelocytes Relative PENDING %   Blasts PENDING %   Immature Granulocytes PENDING %   Abs Immature Granulocytes PENDING 0.00 - 0.07 K/uL  Comprehensive metabolic panel     Status: Abnormal   Collection Time: 11/21/20  1:26 PM  Result Value Ref Range   Sodium 135 135 - 145 mmol/L   Potassium 3.8 3.5 - 5.1 mmol/L   Chloride 101 98 - 111 mmol/L   CO2 26 22 - 32 mmol/L   Glucose, Bld 137 (H) 70 - 99 mg/dL    Comment: Glucose reference range applies only to samples taken after fasting for at least 8 hours.   BUN 6 6 - 20 mg/dL   Creatinine, Ser 0.71 0.61 - 1.24 mg/dL   Calcium 9.0 8.9 - 10.3 mg/dL   Total Protein 8.4 (H) 6.5 - 8.1 g/dL   Albumin 4.8 3.5 - 5.0 g/dL   AST 25 15 - 41 U/L   ALT 34 0 - 44 U/L   Alkaline Phosphatase 80 38 - 126 U/L   Total Bilirubin 1.1 0.3 - 1.2 mg/dL   GFR, Estimated >60 >60 mL/min    Comment: (NOTE) Calculated using the CKD-EPI Creatinine Equation (2021)    Anion gap 8 5 - 15    Comment: Performed at [facility], [place] [address], [place], [place] [address]    DG Abdomen 1 View  Result Date: 11/21/2020 CLINICAL DATA:  Constipation EXAM: ABDOMEN - 1 VIEW COMPARISON:  None. FINDINGS: Moderate amount of stool throughout the colon. No focal consolidation. No pleural effusion or pneumothorax. Heart and mediastinal contours are unremarkable. No acute osseous abnormality. IMPRESSION: Moderate amount of stool throughout the colon. Electronically Signed   By: Kathreen Devoid M.D.   On: 11/21/2020 11:52   CT ABDOMEN PELVIS W  CONTRAST  Result Date: 11/21/2020 CLINICAL DATA:  Acute  nonlocalized abdominal pain. EXAM: CT ABDOMEN AND PELVIS WITH CONTRAST TECHNIQUE: Multidetector CT imaging of the abdomen and pelvis was performed using the standard protocol following bolus administration of intravenous contrast. CONTRAST:  64mL OMNIPAQUE IOHEXOL 350 MG/ML SOLN COMPARISON:  None. FINDINGS: Lower chest: Hypoventilatory change in the dependent lungs. Hepatobiliary: Diffuse hepatic steatosis with focal fatty sparing along the gallbladder fossa. Gallbladder is unremarkable. No biliary ductal dilation. Pancreas: No pancreatic ductal dilation or evidence of acute inflammation. Spleen: Within normal limits. Adrenals/Urinary Tract: Bilateral adrenal glands are unremarkable. No hydronephrosis. No solid enhancing renal mass. Urinary bladder is unremarkable for degree of distension. Stomach/Bowel: Layering hyperdense material in the stomach likely reflects ingested contents. No pathologic dilation of large or small bowel. Inflamed dilated fluid and gas-filled appendix is located in the right hemipelvis measuring 17 mm in diameter with adjacent inflammatory stranding. Vascular/Lymphatic: No abdominal aortic aneurysm. No pathologically enlarged abdominal or pelvic lymph nodes. Reproductive: Prostate is unremarkable. Other: No walled off fluid collections.  No pneumoperitoneum. Musculoskeletal: Mild thoracolumbar spondylosis. Degenerative changes of the bilateral hips. No acute osseous abnormality. IMPRESSION: 1. Acute uncomplicated appendicitis. 2. Diffuse hepatic steatosis. Electronically Signed   By: Maudry Mayhew M.D.   On: 11/21/2020 16:38     A/P: Marcus Suarez is an 50 y.o. male with acute appendicitis  I discussed the diagnosis with the patient.  I reviewed his CAT scan and laboratory data.  Removal of the appendix is recommended.  I discussed proceeding with a laparoscopic appendectomy.  We discussed the surgical procedure in detail.  We  discussed the risk which includes but is not limited to bleeding, infection, injury to surrounding structures, the need to convert to an open procedure, appendiceal stump leak, ongoing infection, the need for other procedures, cardiopulmonary issues, DVT, etc.  He understands and agrees to proceed with surgery which is scheduled urgently.  Abigail Miyamoto, MD [facility] Surgery Use [email or web address] to contact on call provider

## 2020-11-21 NOTE — Anesthesia Procedure Notes (Signed)
Procedure Name: Intubation Date/Time: 11/21/2020 8:12 PM Performed by: Gerald Leitz, CRNA Pre-anesthesia Checklist: Patient identified, Patient being monitored, Timeout performed, Emergency Drugs available and Suction available Patient Re-evaluated:Patient Re-evaluated prior to induction Oxygen Delivery Method: Circle system utilized Preoxygenation: Pre-oxygenation with 100% oxygen Induction Type: IV induction and Rapid sequence Ventilation: Mask ventilation without difficulty Laryngoscope Size: Mac and 3 Grade View: Grade I Tube type: Oral Tube size: 7.5 mm Number of attempts: 1 Airway Equipment and Method: Stylet Placement Confirmation: ETT inserted through vocal cords under direct vision, positive ETCO2 and breath sounds checked- equal and bilateral Secured at: 24 cm Tube secured with: Tape Dental Injury: Teeth and Oropharynx as per pre-operative assessment  Difficulty Due To: Difficult Airway- due to anterior larynx Comments: Use MAC 4

## 2020-11-21 NOTE — ED Provider Notes (Signed)
[place] DEPT Provider Note   CSN: [id number] Arrival date & time: 11/21/20  1300     History Chief Complaint  Patient presents with   Abdominal Pain    Marcus Suarez is a 50 y.o. male.  This is a 50 y.o. male with significant medical history as below, including marijuana use, obesity who presents to the ED with complaint of abd pain  Location:  peri umbilical then to rlq Duration:  24 hrs Onset:  gradual Timing:  constant Description:  aching, bloating feeling Severity:  mild Exacerbating/Alleviating Factors:  none identified Associated Symptoms:  anorexia, mild nausea Pertinent Negatives:  no fevers or chills, no cp or dib, no change to bowel or bladder fxn, no ivdu Context: ate [place] food yesterday around 1500, felt bloated or like the food was bad, abd discomfort since then. No solid food since then due to poor appetite and discomfort, tolerating liquids. No prior abd surgery   The history is provided by the patient. No language interpreter was used.  Abdominal Pain Associated symptoms: nausea   Associated symptoms: no chest pain, no chills, no cough, no fever, no hematuria, no shortness of breath and no vomiting       History reviewed. No pertinent past medical history.  Patient Active Problem List   Diagnosis Date Noted   Acute appendicitis 11/21/2020    Past Surgical History:  Procedure Laterality Date   KNEE ARTHROSCOPY     LEG SURGERY         Family History  Problem Relation Age of Onset   Osteoarthritis Father     Social History   Tobacco Use   Smoking status: Never   Smokeless tobacco: Never  Substance Use Topics   Alcohol use: Yes    Comment: occasionally   Drug use: Yes    Types: Marijuana    Home Medications Prior to Admission medications   Medication Sig Start Date End Date Taking? Authorizing Provider  diclofenac (VOLTAREN) 75 MG EC tablet Take 1 tablet (75 mg total) by mouth 2 (two) times  daily. Patient not taking: No sig reported 02/16/19   Wallene Huh, DPM    Allergies    Patient has no known allergies.  Review of Systems   Review of Systems  Constitutional:  Positive for appetite change. Negative for chills and fever.  HENT:  Negative for facial swelling and trouble swallowing.   Eyes:  Negative for photophobia and visual disturbance.  Respiratory:  Negative for cough and shortness of breath.   Cardiovascular:  Negative for chest pain and palpitations.  Gastrointestinal:  Positive for abdominal pain and nausea. Negative for vomiting.  Endocrine: Negative for polydipsia and polyuria.  Genitourinary:  Negative for difficulty urinating and hematuria.  Musculoskeletal:  Negative for gait problem and joint swelling.  Skin:  Negative for pallor and rash.  Neurological:  Negative for syncope and headaches.  Psychiatric/Behavioral:  Negative for agitation and confusion.    Physical Exam Updated Vital Signs BP (!) 169/88   Pulse 99   Temp 99 F (37.2 C) (Oral)   Resp 16   Ht 6' 2.5" (1.892 m)   Wt 136.1 kg   SpO2 96%   BMI 38.00 kg/m   Physical Exam Vitals and nursing note reviewed.  Constitutional:      General: He is not in acute distress.    Appearance: He is well-developed.  HENT:     Head: Normocephalic and atraumatic.     Right Ear:  External ear normal.     Left Ear: External ear normal.     Mouth/Throat:     Mouth: Mucous membranes are moist.  Eyes:     General: No scleral icterus. Cardiovascular:     Rate and Rhythm: Normal rate and regular rhythm.     Pulses: Normal pulses.     Heart sounds: Normal heart sounds.  Pulmonary:     Effort: Pulmonary effort is normal. No respiratory distress.     Breath sounds: Normal breath sounds.  Abdominal:     General: Abdomen is flat.     Palpations: Abdomen is soft.     Tenderness: There is abdominal tenderness in the right lower quadrant and periumbilical area.     Comments: Not surgical abdomen   Musculoskeletal:        General: Normal range of motion.     Cervical back: Normal range of motion.     Right lower leg: No edema.     Left lower leg: No edema.  Skin:    General: Skin is warm and dry.     Capillary Refill: Capillary refill takes less than 2 seconds.  Neurological:     Mental Status: He is alert and oriented to person, place, and time.  Psychiatric:        Mood and Affect: Mood normal.        Behavior: Behavior normal.    ED Results / Procedures / Treatments   Labs (all labs ordered are listed, but only abnormal results are displayed) Labs Reviewed  CBC WITH DIFFERENTIAL/PLATELET - Abnormal; Notable for the following components:      Result Value   WBC 19.4 (*)    Neutro Abs 17.5 (*)    Abs Immature Granulocytes 0.12 (*)    All other components within normal limits  COMPREHENSIVE METABOLIC PANEL - Abnormal; Notable for the following components:   Glucose, Bld 137 (*)    Total Protein 8.4 (*)    All other components within normal limits  URINALYSIS, ROUTINE W REFLEX MICROSCOPIC - Abnormal; Notable for the following components:   Specific Gravity, Urine >1.046 (*)    Hgb urine dipstick MODERATE (*)    Protein, ur 30 (*)    RBC / HPF >50 (*)    Bacteria, UA RARE (*)    All other components within normal limits  RESP PANEL BY RT-PCR (FLU A&B, COVID) ARPGX2  CBC  BASIC METABOLIC PANEL  SURGICAL PATHOLOGY    EKG None  Radiology DG Abdomen 1 View  Result Date: 11/21/2020 CLINICAL DATA:  Constipation EXAM: ABDOMEN - 1 VIEW COMPARISON:  None. FINDINGS: Moderate amount of stool throughout the colon. No focal consolidation. No pleural effusion or pneumothorax. Heart and mediastinal contours are unremarkable. No acute osseous abnormality. IMPRESSION: Moderate amount of stool throughout the colon. Electronically Signed   By: Elige KoHetal  Patel M.D.   On: 11/21/2020 11:52   CT ABDOMEN PELVIS W CONTRAST  Result Date: 11/21/2020 CLINICAL DATA:  Acute nonlocalized  abdominal pain. EXAM: CT ABDOMEN AND PELVIS WITH CONTRAST TECHNIQUE: Multidetector CT imaging of the abdomen and pelvis was performed using the standard protocol following bolus administration of intravenous contrast. CONTRAST:  80mL OMNIPAQUE IOHEXOL 350 MG/ML SOLN COMPARISON:  None. FINDINGS: Lower chest: Hypoventilatory change in the dependent lungs. Hepatobiliary: Diffuse hepatic steatosis with focal fatty sparing along the gallbladder fossa. Gallbladder is unremarkable. No biliary ductal dilation. Pancreas: No pancreatic ductal dilation or evidence of acute inflammation. Spleen: Within normal limits. Adrenals/Urinary Tract: Bilateral  adrenal glands are unremarkable. No hydronephrosis. No solid enhancing renal mass. Urinary bladder is unremarkable for degree of distension. Stomach/Bowel: Layering hyperdense material in the stomach likely reflects ingested contents. No pathologic dilation of large or small bowel. Inflamed dilated fluid and gas-filled appendix is located in the right hemipelvis measuring 17 mm in diameter with adjacent inflammatory stranding. Vascular/Lymphatic: No abdominal aortic aneurysm. No pathologically enlarged abdominal or pelvic lymph nodes. Reproductive: Prostate is unremarkable. Other: No walled off fluid collections.  No pneumoperitoneum. Musculoskeletal: Mild thoracolumbar spondylosis. Degenerative changes of the bilateral hips. No acute osseous abnormality. IMPRESSION: 1. Acute uncomplicated appendicitis. 2. Diffuse hepatic steatosis. Electronically Signed   By: Maudry Mayhew M.D.   On: 11/21/2020 16:38    Procedures Procedures   Medications Ordered in ED Medications  enoxaparin (LOVENOX) injection 40 mg (has no administration in time range)  0.9 % NaCl with KCl 20 mEq/ L  infusion ( Intravenous New Bag/Given 11/21/20 2304)  acetaminophen (TYLENOL) tablet 1,000 mg (1,000 mg Oral Given 11/21/20 2304)  traMADol (ULTRAM) tablet 50 mg (has no administration in time range)   oxyCODONE (Oxy IR/ROXICODONE) immediate release tablet 5-10 mg (has no administration in time range)  HYDROmorphone (DILAUDID) injection 1 mg (has no administration in time range)  diphenhydrAMINE (BENADRYL) capsule 25 mg (has no administration in time range)    Or  diphenhydrAMINE (BENADRYL) injection 25 mg (has no administration in time range)  ondansetron (ZOFRAN-ODT) disintegrating tablet 4 mg (has no administration in time range)    Or  ondansetron (ZOFRAN) injection 4 mg (has no administration in time range)  methocarbamol (ROBAXIN) tablet 500 mg (500 mg Oral Given 11/21/20 2304)  iohexol (OMNIPAQUE) 350 MG/ML injection 80 mL (80 mLs Intravenous Contrast Given 11/21/20 1627)  piperacillin-tazobactam (ZOSYN) IVPB 4.5 g (0 g Intravenous Stopped 11/21/20 1909)  sodium chloride 0.9 % bolus 1,000 mL (1,000 mLs Intravenous New Bag/Given 11/21/20 1839)  morphine 4 MG/ML injection 4 mg (4 mg Intravenous Given 11/21/20 1835)  ondansetron (ZOFRAN) injection 4 mg (4 mg Intravenous Given 11/21/20 1834)  lactated ringers infusion (1,000 mLs  New Bag/Given 11/21/20 2032)    ED Course  I have reviewed the triage vital signs and the nursing notes.  Pertinent labs & imaging results that were available during my care of the patient were reviewed by me and considered in my medical decision making (see chart for details).    MDM Rules/Calculators/A&P                           CC: abd pain  This patient complains of above this involves an extensive number of treatment options and is a complaint that carries with it a high risk of complications and morbidity. Vital signs were reviewed. Serious etiologies considered.  Record review:  Previous records obtained and reviewed    Work up as above, notable for:  Labs & imaging results that were available during my care of the patient were reviewed by me and considered in my medical decision making.   I ordered imaging studies which included ct a/p and I  independently visualized and interpreted imaging which showed acute uncomplicated appendicitis  CBC with leukocytosis 19.4, CMP is stable.  COVID-19 testing sent.  Start patient on Zosyn, consult general surgery, diet n.p.o., maintenance fluids ordered.  Analgesia ordered  D/w gen surg Dr Rayburn Ma, will come to evaluate the patient, will admit to surgical service. Pt agreeable with plan, all questions answered at bedside. Stable  for admit.         This chart was dictated using voice recognition software.  Despite best efforts to proofread,  errors can occur which can change the documentation meaning.  Final Clinical Impression(s) / ED Diagnoses Final diagnoses:  Acute appendicitis, unspecified acute appendicitis type    Rx / DC Orders ED Discharge Orders     None        Jeanell Sparrow, DO 11/22/20 0102

## 2020-11-21 NOTE — ED Notes (Signed)
Patient is being discharged from the Urgent Care and sent to the Emergency Department via POV . Per Clent Jacks PA, patient is in need of higher level of care due to possible pancreatitis  . Patient is aware and verbalizes understanding of plan of care.  Vitals:   11/21/20 1105  BP: (!) 181/96  Pulse: 76  Resp: (!) 24  Temp: 98.2 F (36.8 C)  SpO2: 100%

## 2020-11-21 NOTE — Transfer of Care (Signed)
Immediate Anesthesia Transfer of Care Note  Patient: Marcus Suarez  Procedure(s) Performed: Procedure(s): APPENDECTOMY LAPAROSCOPIC (N/A)  Patient Location: PACU  Anesthesia Type:General  Level of Consciousness: Alert, Awake, Oriented  Airway & Oxygen Therapy: Patient Spontanous Breathing  Post-op Assessment: Report given to RN  Post vital signs: Reviewed and stable  Last Vitals:  Vitals:   11/21/20 1817 11/21/20 1900  BP: (!) 161/91 (!) 161/79  Pulse: (!) 101 (!) 106  Resp: 18 18  Temp:    SpO2: 99% 92%    Complications: No apparent anesthesia complications

## 2020-11-21 NOTE — Anesthesia Postprocedure Evaluation (Signed)
Anesthesia Post Note  Patient: Marcus Suarez  Procedure(s) Performed: APPENDECTOMY LAPAROSCOPIC (Abdomen)     Patient location during evaluation: PACU Anesthesia Type: General Level of consciousness: awake Pain management: pain level controlled Vital Signs Assessment: post-procedure vital signs reviewed and stable Respiratory status: spontaneous breathing, nonlabored ventilation, respiratory function stable and patient connected to nasal cannula oxygen Cardiovascular status: blood pressure returned to baseline and stable Postop Assessment: no apparent nausea or vomiting Anesthetic complications: no   No notable events documented.  Last Vitals:  Vitals:   11/21/20 2200 11/21/20 2215  BP: (!) 144/84 (!) 151/78  Pulse: 86 96  Resp: 12 13  Temp:    SpO2: 96% 98%    Last Pain:  Vitals:   11/21/20 2215  TempSrc:   PainSc: 1                  Linnette Panella P Belina Mandile

## 2020-11-22 ENCOUNTER — Encounter (HOSPITAL_COMMUNITY): Payer: Self-pay | Admitting: Surgery

## 2020-11-22 LAB — CBC
HCT: 44.2 % (ref 39.0–52.0)
Hemoglobin: 13.9 g/dL (ref 13.0–17.0)
MCH: 27.9 pg (ref 26.0–34.0)
MCHC: 31.4 g/dL (ref 30.0–36.0)
MCV: 88.6 fL (ref 80.0–100.0)
Platelets: 244 10*3/uL (ref 150–400)
RBC: 4.99 MIL/uL (ref 4.22–5.81)
RDW: 13 % (ref 11.5–15.5)
WBC: 19.4 10*3/uL — ABNORMAL HIGH (ref 4.0–10.5)
nRBC: 0 % (ref 0.0–0.2)

## 2020-11-22 LAB — BASIC METABOLIC PANEL
Anion gap: 10 (ref 5–15)
BUN: 9 mg/dL (ref 6–20)
CO2: 23 mmol/L (ref 22–32)
Calcium: 8.8 mg/dL — ABNORMAL LOW (ref 8.9–10.3)
Chloride: 102 mmol/L (ref 98–111)
Creatinine, Ser: 0.72 mg/dL (ref 0.61–1.24)
GFR, Estimated: >60 mL/min (ref >60)
Glucose, Bld: 151 mg/dL — ABNORMAL HIGH (ref 70–99)
Potassium: 3.9 mmol/L (ref 3.5–5.1)
Sodium: 135 mmol/L (ref 135–145)

## 2020-11-22 MED ORDER — ACETAMINOPHEN 500 MG PO TABS: 1000.0000 mg | ORAL_TABLET | Freq: Four times a day (QID) | ORAL | 0 refills | Status: DC | PRN

## 2020-11-22 MED ORDER — OXYCODONE HCL 5 MG PO TABS: 5.0000 mg | ORAL_TABLET | ORAL | Status: DC | PRN

## 2020-11-22 MED ORDER — HYDROMORPHONE HCL 1 MG/ML IJ SOLN
1.0000 mg | INTRAMUSCULAR | Status: DC | PRN
  Filled 2020-11-22: qty 1

## 2020-11-22 MED ORDER — OXYCODONE HCL 5 MG PO TABS: 5.0000 mg | ORAL_TABLET | Freq: Four times a day (QID) | ORAL | 0 refills | Status: DC | PRN

## 2020-11-22 NOTE — Discharge Summary (Signed)
[facility] Discharge Summary   Patient ID: Marcus Suarez MRN: [id number] DOB/AGE: 05-25-1970 50 y.o.  Admit date: 11/21/2020 Discharge date: 11/22/2020  Admitting Diagnosis: Acute appendicitis  Discharge Diagnosis Patient Active Problem List   Diagnosis Date Noted   Acute appendicitis 11/21/2020    Consultants None  Imaging: DG Abdomen 1 View  Result Date: 11/21/2020 CLINICAL DATA:  Constipation EXAM: ABDOMEN - 1 VIEW COMPARISON:  None. FINDINGS: Moderate amount of stool throughout the colon. No focal consolidation. No pleural effusion or pneumothorax. Heart and mediastinal contours are unremarkable. No acute osseous abnormality. IMPRESSION: Moderate amount of stool throughout the colon. Electronically Signed   By: Elige Ko M.D.   On: 11/21/2020 11:52   CT ABDOMEN PELVIS W CONTRAST  Result Date: 11/21/2020 CLINICAL DATA:  Acute nonlocalized abdominal pain. EXAM: CT ABDOMEN AND PELVIS WITH CONTRAST TECHNIQUE: Multidetector CT imaging of the abdomen and pelvis was performed using the standard protocol following bolus administration of intravenous contrast. CONTRAST:  69mL OMNIPAQUE IOHEXOL 350 MG/ML SOLN COMPARISON:  None. FINDINGS: Lower chest: Hypoventilatory change in the dependent lungs. Hepatobiliary: Diffuse hepatic steatosis with focal fatty sparing along the gallbladder fossa. Gallbladder is unremarkable. No biliary ductal dilation. Pancreas: No pancreatic ductal dilation or evidence of acute inflammation. Spleen: Within normal limits. Adrenals/Urinary Tract: Bilateral adrenal glands are unremarkable. No hydronephrosis. No solid enhancing renal mass. Urinary bladder is unremarkable for degree of distension. Stomach/Bowel: Layering hyperdense material in the stomach likely reflects ingested contents. No pathologic dilation of large or small bowel. Inflamed dilated fluid and gas-filled appendix is located in the right hemipelvis measuring 17 mm in diameter with  adjacent inflammatory stranding. Vascular/Lymphatic: No abdominal aortic aneurysm. No pathologically enlarged abdominal or pelvic lymph nodes. Reproductive: Prostate is unremarkable. Other: No walled off fluid collections.  No pneumoperitoneum. Musculoskeletal: Mild thoracolumbar spondylosis. Degenerative changes of the bilateral hips. No acute osseous abnormality. IMPRESSION: 1. Acute uncomplicated appendicitis. 2. Diffuse hepatic steatosis. Electronically Signed   By: Maudry Mayhew M.D.   On: 11/21/2020 16:38    Procedures Dr. Magnus Ivan (11/21/2020) - Laparoscopic Appendectomy  Hospital Course:  Marcus Suarez is a 50yo male who presented to [facility] 11/9 with acute onset abdominal pain.  Workup showed acute appendicitis.  Patient was admitted and underwent procedure listed above.  Tolerated procedure well and was transferred to the floor.  Diet was advanced as tolerated.  On POD1, the patient was voiding well, tolerating diet, ambulating well, pain well controlled, vital signs stable, incisions c/d/i and felt stable for discharge home.  Patient will follow up as below and knows to call with questions or concerns.    I have personally reviewed the patients medication history on the [place] controlled substance database.   Physical Exam: General:  Alert, NAD, pleasant, comfortable Pulm: rate and effort normal Abd:  Soft, ND, appropriately tender, multiple lap incisions C/D/I  Allergies as of 11/22/2020   No Known Allergies      Medication List     STOP taking these medications    diclofenac 75 MG EC tablet Commonly known as: VOLTAREN       TAKE these medications    acetaminophen 500 MG tablet Commonly known as: TYLENOL Take 2 tablets (1,000 mg total) by mouth every 6 (six) hours as needed for mild pain.   oxyCODONE 5 MG immediate release tablet Commonly known as: Oxy IR/ROXICODONE Take 1 tablet (5 mg total) by mouth every 6 (six) hours as needed for severe pain.  Follow-up  Information     [facility], [place]. Call.   Specialty: General Surgery Why: We are working on your appointmen, call to confirm Please arrive 30 minutes prior to your appointment to check in and fill out paperwork. Bring photo ID and insurance information. Contact information: [address] [place] [address] [phone]                Signed: Franne Forts, [facility] 11/22/2020, 9:20 AM Please see Amion for pager number during day hours 7:00am-4:30pm

## 2020-11-22 NOTE — Progress Notes (Signed)
Discharge instructions given to patient and all questions were answered.  

## 2020-11-22 NOTE — Plan of Care (Signed)
  Problem: Education: Goal: Knowledge of General Education information will improve Description: Including pain rating scale, medication(s)/side effects and non-pharmacologic comfort measures 11/22/2020 0436 by Quincy Sheehan, RN Outcome: Progressing 11/22/2020 0032 by Quincy Sheehan, RN Outcome: Progressing   Problem: Health Behavior/Discharge Planning: Goal: Ability to manage health-related needs will improve 11/22/2020 0436 by Quincy Sheehan, RN Outcome: Progressing 11/22/2020 0032 by Quincy Sheehan, RN Outcome: Progressing   Problem: Clinical Measurements: Goal: Ability to maintain clinical measurements within normal limits will improve 11/22/2020 0436 by Quincy Sheehan, RN Outcome: Progressing 11/22/2020 0032 by Quincy Sheehan, RN Outcome: Progressing Goal: Will remain free from infection 11/22/2020 0436 by Quincy Sheehan, RN Outcome: Progressing 11/22/2020 0032 by Quincy Sheehan, RN Outcome: Progressing Goal: Diagnostic test results will improve 11/22/2020 0436 by Quincy Sheehan, RN Outcome: Progressing 11/22/2020 0032 by Quincy Sheehan, RN Outcome: Progressing Goal: Respiratory complications will improve 11/22/2020 0436 by Quincy Sheehan, RN Outcome: Progressing 11/22/2020 0032 by Quincy Sheehan, RN Outcome: Progressing Goal: Cardiovascular complication will be avoided 11/22/2020 0436 by Quincy Sheehan, RN Outcome: Progressing 11/22/2020 0032 by Quincy Sheehan, RN Outcome: Progressing   Problem: Activity: Goal: Risk for activity intolerance will decrease 11/22/2020 0436 by Quincy Sheehan, RN Outcome: Progressing 11/22/2020 0032 by Quincy Sheehan, RN Outcome: Progressing   Problem: Nutrition: Goal: Adequate nutrition will be maintained 11/22/2020 0436 by Quincy Sheehan, RN Outcome: Progressing 11/22/2020 0032 by Quincy Sheehan, RN Outcome: Progressing   Problem: Coping: Goal: Level  of anxiety will decrease 11/22/2020 0436 by Quincy Sheehan, RN Outcome: Progressing 11/22/2020 0032 by Quincy Sheehan, RN Outcome: Progressing   Problem: Elimination: Goal: Will not experience complications related to bowel motility 11/22/2020 0436 by Quincy Sheehan, RN Outcome: Progressing 11/22/2020 0032 by Quincy Sheehan, RN Outcome: Progressing Goal: Will not experience complications related to urinary retention 11/22/2020 0436 by Quincy Sheehan, RN Outcome: Progressing 11/22/2020 0032 by Quincy Sheehan, RN Outcome: Progressing   Problem: Pain Managment: Goal: General experience of comfort will improve 11/22/2020 0436 by Quincy Sheehan, RN Outcome: Progressing 11/22/2020 0032 by Quincy Sheehan, RN Outcome: Progressing   Problem: Safety: Goal: Ability to remain free from injury will improve 11/22/2020 0436 by Quincy Sheehan, RN Outcome: Progressing 11/22/2020 0032 by Quincy Sheehan, RN Outcome: Progressing   Problem: Skin Integrity: Goal: Risk for impaired skin integrity will decrease 11/22/2020 0436 by Quincy Sheehan, RN Outcome: Progressing 11/22/2020 0032 by Quincy Sheehan, RN Outcome: Progressing

## 2020-11-22 NOTE — Discharge Instructions (Signed)
[facility]  Please arrive at least 30 min before your appointment to complete your check in paperwork.  If you are unable to arrive 30 min prior to your appointment time we may have to cancel or reschedule you. LAPAROSCOPIC SURGERY: POST OP INSTRUCTIONS Always review your discharge instruction sheet given to you by the facility where your surgery was performed. IF YOU HAVE DISABILITY OR FAMILY LEAVE FORMS, YOU MUST BRING THEM TO THE OFFICE FOR PROCESSING.   DO NOT GIVE THEM TO YOUR DOCTOR.  PAIN CONTROL  First take acetaminophen (Tylenol) AND/or ibuprofen (Advil) to control your pain after surgery.  Follow directions on package.  Taking acetaminophen (Tylenol) and/or ibuprofen (Advil) regularly after surgery will help to control your pain and lower the amount of prescription pain medication you may need.  You should not take more than 4,000 mg (4 grams) of acetaminophen (Tylenol) in 24 hours.  You should not take ibuprofen (Advil), aleve, motrin, naprosyn or other NSAIDS if you have a history of stomach ulcers or chronic kidney disease.  A prescription for pain medication may be given to you upon discharge.  Take your pain medication as prescribed, if you still have uncontrolled pain after taking acetaminophen (Tylenol) or ibuprofen (Advil). Use ice packs to help control pain. If you need a refill on your pain medication, please contact your pharmacy.  They will contact our office to request authorization. Prescriptions will not be filled after 5pm or on week-ends.  HOME MEDICATIONS Take your usually prescribed medications unless otherwise directed.  DIET You should follow a light diet the first few days after arrival home.  Be sure to include lots of fluids daily. Avoid fatty, fried foods.   CONSTIPATION It is common to experience some constipation after surgery and if you are taking pain medication.  Increasing fluid intake and taking a stool softener (such as Colace)  will usually help or prevent this problem from occurring.  A mild laxative (Milk of Magnesia or Miralax) should be taken according to package instructions if there are no bowel movements after 48 hours.  WOUND/INCISION CARE Most patients will experience some swelling and bruising in the area of the incisions.  Ice packs will help.  Swelling and bruising can take several days to resolve.  Unless discharge instructions indicate otherwise, follow guidelines below  STERI-STRIPS - you may remove your outer bandages 48 hours after surgery, and you may shower at that time.  You have steri-strips (small skin tapes) in place directly over the incision.  These strips should be left on the skin for 7-10 days.   DERMABOND/SKIN GLUE - you may shower in 24 hours.  The glue will flake off over the next 2-3 weeks. Any sutures or staples will be removed at the office during your follow-up visit.  ACTIVITIES You may resume regular (light) daily activities beginning the next day--such as daily self-care, walking, climbing stairs--gradually increasing activities as tolerated.  You may have sexual intercourse when it is comfortable.  Refrain from any heavy lifting or straining until approved by your doctor. You may drive when you are no longer taking prescription pain medication, you can comfortably wear a seatbelt, and you can safely maneuver your car and apply brakes.  FOLLOW-UP You should see your doctor in the office for a follow-up appointment approximately 2-3 weeks after your surgery.  You should have been given your post-op/follow-up appointment when your surgery was scheduled.  If you did not receive a post-op/follow-up appointment, make sure   that you call for this appointment within a day or two after you arrive home to insure a convenient appointment time.  OTHER INSTRUCTIONS  WHEN TO CALL YOUR DOCTOR: Fever over 101.0 Inability to urinate Continued bleeding from incision. Increased pain, redness, or  drainage from the incision. Increasing abdominal pain  The clinic staff is available to answer your questions during regular business hours.  Please don't hesitate to call and ask to speak to one of the nurses for clinical concerns.  If you have a medical emergency, go to the nearest emergency room or call 911.  A surgeon from [facility] is always on call at the hospital. [address], [address], [place], [place]  [address] ? [address] [address], [place], [place]   [address] [phone] ? [phone] ? [phone]   

## 2020-11-22 NOTE — Plan of Care (Signed)

## 2020-11-23 LAB — SURGICAL PATHOLOGY

## 2022-09-27 ENCOUNTER — Other Ambulatory Visit: Payer: Self-pay

## 2022-09-27 ENCOUNTER — Emergency Department (HOSPITAL_COMMUNITY)
Admission: EM | Admit: 2022-09-27 | Discharge: 2022-09-27 | Disposition: A | Discharge status: Home / Self Care | Payer: No Typology Code available for payment source | Attending: Emergency Medicine | Admitting: Emergency Medicine

## 2022-09-27 ENCOUNTER — Emergency Department (HOSPITAL_COMMUNITY): Payer: No Typology Code available for payment source

## 2022-09-27 ENCOUNTER — Encounter (HOSPITAL_COMMUNITY): Payer: Self-pay

## 2022-09-27 DIAGNOSIS — R519 Headache, unspecified: Secondary | ICD-10-CM | POA: Insufficient documentation

## 2022-09-27 DIAGNOSIS — E876 Hypokalemia: Secondary | ICD-10-CM | POA: Insufficient documentation

## 2022-09-27 DIAGNOSIS — R112 Nausea with vomiting, unspecified: Secondary | ICD-10-CM | POA: Diagnosis not present

## 2022-09-27 DIAGNOSIS — H81399 Other peripheral vertigo, unspecified ear: Secondary | ICD-10-CM

## 2022-09-27 DIAGNOSIS — R42 Dizziness and giddiness: Secondary | ICD-10-CM | POA: Diagnosis present

## 2022-09-27 DIAGNOSIS — R030 Elevated blood-pressure reading, without diagnosis of hypertension: Secondary | ICD-10-CM | POA: Diagnosis not present

## 2022-09-27 LAB — URINALYSIS, ROUTINE W REFLEX MICROSCOPIC
Bilirubin Urine: NEGATIVE
Glucose, UA: NEGATIVE mg/dL
Ketones, ur: NEGATIVE mg/dL
Leukocytes,Ua: NEGATIVE
Nitrite: NEGATIVE
Protein, ur: NEGATIVE mg/dL
Specific Gravity, Urine: 1.015 (ref 1.005–1.030)
pH: 8 (ref 5.0–8.0)

## 2022-09-27 LAB — COMPREHENSIVE METABOLIC PANEL
ALT: 24 U/L (ref 0–44)
AST: 21 U/L (ref 15–41)
Albumin: 3.4 g/dL — ABNORMAL LOW (ref 3.5–5.0)
Alkaline Phosphatase: 60 U/L (ref 38–126)
Anion gap: 10 (ref 5–15)
BUN: 5 mg/dL — ABNORMAL LOW (ref 6–20)
CO2: 22 mmol/L (ref 22–32)
Calcium: 7.4 mg/dL — ABNORMAL LOW (ref 8.9–10.3)
Chloride: 109 mmol/L (ref 98–111)
Creatinine, Ser: 0.75 mg/dL (ref 0.61–1.24)
GFR, Estimated: >60 mL/min (ref >60)
Glucose, Bld: 90 mg/dL (ref 70–99)
Potassium: 3.2 mmol/L — ABNORMAL LOW (ref 3.5–5.1)
Sodium: 141 mmol/L (ref 135–145)
Total Bilirubin: 1 mg/dL (ref 0.3–1.2)
Total Protein: 6.1 g/dL — ABNORMAL LOW (ref 6.5–8.1)

## 2022-09-27 LAB — CBC
HCT: 44.4 % (ref 39.0–52.0)
Hemoglobin: 13.8 g/dL (ref 13.0–17.0)
MCH: 27.9 pg (ref 26.0–34.0)
MCHC: 31.1 g/dL (ref 30.0–36.0)
MCV: 89.9 fL (ref 80.0–100.0)
Platelets: 208 10*3/uL (ref 150–400)
RBC: 4.94 MIL/uL (ref 4.22–5.81)
RDW: 12.7 % (ref 11.5–15.5)
WBC: 16.6 10*3/uL — ABNORMAL HIGH (ref 4.0–10.5)
nRBC: 0 % (ref 0.0–0.2)

## 2022-09-27 LAB — URINALYSIS, MICROSCOPIC (REFLEX)

## 2022-09-27 LAB — TROPONIN I (HIGH SENSITIVITY): Troponin I (High Sensitivity): 4 ng/L (ref <18)

## 2022-09-27 LAB — MAGNESIUM: Magnesium: 1.7 mg/dL (ref 1.7–2.4)

## 2022-09-27 MED ORDER — POTASSIUM CHLORIDE CRYS ER 20 MEQ PO TBCR: EXTENDED_RELEASE_TABLET | ORAL | 0 refills | Status: DC

## 2022-09-27 MED ORDER — ONDANSETRON HCL 4 MG/2ML IJ SOLN
4.0000 mg | Freq: Once | INTRAMUSCULAR | Status: AC
  Administered 2022-09-27: 4 mg via INTRAVENOUS
  Filled 2022-09-27: qty 2

## 2022-09-27 MED ORDER — CALCIUM CARBONATE 1250 (500 CA) MG PO TABS
1.0000 | ORAL_TABLET | Freq: Once | ORAL | Status: AC
  Administered 2022-09-27: 1250 mg via ORAL
  Filled 2022-09-27: qty 1

## 2022-09-27 MED ORDER — LACTATED RINGERS IV BOLUS
1000.0000 mL | Freq: Once | INTRAVENOUS | Status: AC
  Administered 2022-09-27: 1000 mL via INTRAVENOUS

## 2022-09-27 MED ORDER — MECLIZINE HCL 25 MG PO TABS
12.5000 mg | ORAL_TABLET | Freq: Once | ORAL | Status: AC
  Administered 2022-09-27: 12.5 mg via ORAL
  Filled 2022-09-27: qty 1

## 2022-09-27 MED ORDER — POTASSIUM CHLORIDE CRYS ER 20 MEQ PO TBCR
40.0000 meq | EXTENDED_RELEASE_TABLET | Freq: Once | ORAL | Status: AC
  Administered 2022-09-27: 40 meq via ORAL
  Filled 2022-09-27: qty 2

## 2022-09-27 MED ORDER — MECLIZINE HCL 12.5 MG PO TABS: 12.5000 mg | ORAL_TABLET | Freq: Three times a day (TID) | ORAL | 0 refills | Status: DC | PRN

## 2022-09-27 NOTE — ED Provider Notes (Signed)
[place] EMERGENCY DEPARTMENT AT [facility] Provider Note   CSN: [id number] Arrival date & time: 09/27/22  1534     History  Chief Complaint  Patient presents with   Dizziness    Marcus Suarez is a 52 y.o. male.  Pt with c/o dizziness, acute onset today. States felt room-spinning and also unsteady on feet, with nausea/vomiting. Emesis not bloody or bilious. No abd pain or distension, having normal bms. Felt fine/normal earlier this AM.  Denies hx similar symptoms in past.  No hx vertigo. Denies uri symptoms, no congestion, rhinorrhea, sore throat, or cough. No ear pain, tinnitus, or hearing loss. Dull headache, left. No abrupt/severe head pain. No neck pain or stiffness. No associated change in speech or vision. No new numbness or weakness. Denies any current or recent chest pain or discomfort. No sob. No palpitation. No syncope. EMS notes bp elevated, 168/92 and cbg 142.  The history is provided by the patient, the EMS personnel and medical records.  Dizziness Associated symptoms: headaches, nausea and vomiting   Associated symptoms: no chest pain, no diarrhea, no hearing loss, no palpitations, no shortness of breath, no tinnitus and no weakness        Home Medications Prior to Admission medications   Not on File      Allergies    Patient has no known allergies.    Review of Systems   Review of Systems  Constitutional:  Negative for fever.  HENT:  Negative for congestion, ear pain, hearing loss, sinus pain, sore throat and tinnitus.   Eyes:  Negative for pain, redness and visual disturbance.  Respiratory:  Negative for cough and shortness of breath.   Cardiovascular:  Negative for chest pain, palpitations and leg swelling.  Gastrointestinal:  Positive for nausea and vomiting. Negative for abdominal distention, abdominal pain and diarrhea.  Genitourinary:  Negative for dysuria and flank pain.  Musculoskeletal:  Negative for back pain and neck pain.  Skin:   Negative for rash.  Neurological:  Positive for dizziness and headaches. Negative for speech difficulty, weakness and numbness.  Hematological:  Does not bruise/bleed easily.  Psychiatric/Behavioral:  Negative for confusion.     Physical Exam Updated Vital Signs BP (!) 156/82   Pulse 73   Temp 97.9 F (36.6 C) (Oral)   Resp 16   Ht 1.892 m (6' 2.5")   Wt (!) 151 kg   SpO2 98%   BMI 42.18 kg/m  Physical Exam Vitals and nursing note reviewed.  Constitutional:      Appearance: Normal appearance. He is well-developed.  HENT:     Head: Atraumatic.     Right Ear: Tympanic membrane, ear canal and external ear normal.     Left Ear: Tympanic membrane, ear canal and external ear normal.     Nose: Nose normal.     Mouth/Throat:     Mouth: Mucous membranes are moist.  Eyes:     General: No scleral icterus.    Extraocular Movements: Extraocular movements intact.     Conjunctiva/sclera: Conjunctivae normal.     Pupils: Pupils are equal, round, and reactive to light.  Neck:     Vascular: No carotid bruit.     Trachea: No tracheal deviation.  Cardiovascular:     Rate and Rhythm: Normal rate and regular rhythm.     Pulses: Normal pulses.     Heart sounds: Normal heart sounds. No murmur heard.    No friction rub. No gallop.  Pulmonary:  Effort: Pulmonary effort is normal. No accessory muscle usage or respiratory distress.     Breath sounds: Normal breath sounds.  Abdominal:     General: Bowel sounds are normal. There is no distension.     Palpations: Abdomen is soft. There is no mass.     Tenderness: There is no abdominal tenderness.  Genitourinary:    Comments: No cva tenderness. Musculoskeletal:        General: No swelling or tenderness.     Cervical back: Normal range of motion and neck supple. No rigidity.     Right lower leg: No edema.     Left lower leg: No edema.  Skin:    General: Skin is warm and dry.     Findings: No rash.  Neurological:     Mental Status: He is  alert and oriented to person, place, and time.     Comments: Alert, speech clear, no dysarthria or aphasia. Motor/sens grossly intact bil. Stre 5/5. No pronator drift.   Psychiatric:        Mood and Affect: Mood normal.     ED Results / Procedures / Treatments   Labs (all labs ordered are listed, but only abnormal results are displayed) Results for orders placed or performed during the hospital encounter of 09/27/22  CBC  Result Value Ref Range   WBC 16.6 (H) 4.0 - 10.5 K/uL   RBC 4.94 4.22 - 5.81 MIL/uL   Hemoglobin 13.8 13.0 - 17.0 g/dL   HCT 13.2 44.0 - 10.2 %   MCV 89.9 80.0 - 100.0 fL   MCH 27.9 26.0 - 34.0 pg   MCHC 31.1 30.0 - 36.0 g/dL   RDW 72.5 36.6 - 44.0 %   Platelets 208 150 - 400 K/uL   nRBC 0.0 0.0 - 0.2 %  Comprehensive metabolic panel  Result Value Ref Range   Sodium 141 135 - 145 mmol/L   Potassium 3.2 (L) 3.5 - 5.1 mmol/L   Chloride 109 98 - 111 mmol/L   CO2 22 22 - 32 mmol/L   Glucose, Bld 90 70 - 99 mg/dL   BUN 5 (L) 6 - 20 mg/dL   Creatinine, Ser 3.47 0.61 - 1.24 mg/dL   Calcium 7.4 (L) 8.9 - 10.3 mg/dL   Total Protein 6.1 (L) 6.5 - 8.1 g/dL   Albumin 3.4 (L) 3.5 - 5.0 g/dL   AST 21 15 - 41 U/L   ALT 24 0 - 44 U/L   Alkaline Phosphatase 60 38 - 126 U/L   Total Bilirubin 1.0 0.3 - 1.2 mg/dL   GFR, Estimated >42 >59 mL/min   Anion gap 10 5 - 15  Urinalysis, Routine w reflex microscopic -Urine, Clean Catch  Result Value Ref Range   Color, Urine YELLOW YELLOW   APPearance CLEAR CLEAR   Specific Gravity, Urine 1.015 1.005 - 1.030   pH 8.0 5.0 - 8.0   Glucose, UA NEGATIVE NEGATIVE mg/dL   Hgb urine dipstick SMALL (A) NEGATIVE   Bilirubin Urine NEGATIVE NEGATIVE   Ketones, ur NEGATIVE NEGATIVE mg/dL   Protein, ur NEGATIVE NEGATIVE mg/dL   Nitrite NEGATIVE NEGATIVE   Leukocytes,Ua NEGATIVE NEGATIVE  Magnesium  Result Value Ref Range   Magnesium 1.7 1.7 - 2.4 mg/dL  Urinalysis, Microscopic (reflex)  Result Value Ref Range   RBC / HPF 0-5 0 - 5  RBC/hpf   WBC, UA 0-5 0 - 5 WBC/hpf   Bacteria, UA RARE (A) NONE SEEN   Squamous Epithelial / HPF  0-5 0 - 5 /HPF  Troponin I (High Sensitivity)  Result Value Ref Range   Troponin I (High Sensitivity) 4 <18 ng/L      EKG EKG Interpretation Date/Time:  Saturday September 27 2022 15:58:43 EDT Ventricular Rate:  63 PR Interval:  172 QRS Duration:  116 QT Interval:  472 QTC Calculation: 484 R Axis:   46  Text Interpretation: Sinus rhythm Nonspecific intraventricular conduction delay Nonspecific ST abnormality No significant change since last tracing Confirmed by Cathren Laine ([id number]) on 09/27/2022 4:13:19 PM  Radiology MR BRAIN WO CONTRAST  Result Date: 09/27/2022 CLINICAL DATA:  Syncope and vertigo EXAM: MRI HEAD WITHOUT CONTRAST TECHNIQUE: Multiplanar, multiecho pulse sequences of the brain and surrounding structures were obtained without intravenous contrast. COMPARISON:  None Available. FINDINGS: Brain: On axial diffusion-weighted imaging, there is an area of hyperintensity at the posterior cingulate gyrus that is not confirmed on any other sequence and is likely artifactual. No acute infarct. No chronic microhemorrhage or siderosis. There is mild multifocal hyperintense T2-weighted signal within the white matter. Parenchymal volume and CSF spaces are normal. The midline structures are normal. There is no cerebellopontine angle mass. The cochleae and semicircular canals are normal. No focal abnormality along the course of the 7th and 8th cranial nerves. Normal porus acusticus and vestibular aqueduct bilaterally. Vascular: Normal flow voids. Skull and upper cervical spine: Normal marrow signal. Sinuses/Orbits: Negative. Other: None. IMPRESSION: 1. Normal MRI of the internal auditory canals and inner ear structures. 2. Mild multifocal hyperintense T2-weighted signal within the white matter, nonspecific but may be seen in the setting of migraine headaches or chronic small vessel ischemia.  Electronically Signed   By: Deatra Robinson M.D.   On: 09/27/2022 19:56   CT Head Wo Contrast  Result Date: 09/27/2022 CLINICAL DATA:  Headache, dizziness, nausea and vomiting EXAM: CT HEAD WITHOUT CONTRAST TECHNIQUE: Contiguous axial images were obtained from the base of the skull through the vertex without intravenous contrast. RADIATION DOSE REDUCTION: This exam was performed according to the departmental dose-optimization program which includes automated exposure control, adjustment of the mA and/or kV according to patient size and/or use of iterative reconstruction technique. COMPARISON:  None Available. FINDINGS: Brain: No acute infarct or hemorrhage. Lateral ventricles and midline structures are unremarkable. No acute extra-axial fluid collections. No mass effect. Vascular: No hyperdense vessel or unexpected calcification. Skull: Normal. Negative for fracture or focal lesion. Sinuses/Orbits: No acute finding. Other: None. IMPRESSION: 1. No acute intracranial process. Electronically Signed   By: Sharlet Salina M.D.   On: 09/27/2022 16:52    Procedures Procedures    Medications Ordered in ED Medications  lactated ringers bolus 1,000 mL (0 mLs Intravenous Stopped 09/27/22 1916)  ondansetron (ZOFRAN) injection 4 mg (4 mg Intravenous Given 09/27/22 1733)  meclizine (ANTIVERT) tablet 12.5 mg (12.5 mg Oral Given 09/27/22 1734)  potassium chloride SA (KLOR-CON M) CR tablet 40 mEq (40 mEq Oral Given 09/27/22 2017)  calcium carbonate (OS-CAL - dosed in mg of elemental calcium) tablet 1,250 mg (1,250 mg Oral Given 09/27/22 2145)    ED Course/ Medical Decision Making/ A&P                                 Medical Decision Making Problems Addressed: Dizziness: acute illness or injury with systemic symptoms that poses a threat to life or bodily functions Elevated blood pressure reading: acute illness or injury Hypocalcemia: acute illness or injury Hypokalemia: acute  illness or injury Peripheral vertigo,  unspecified laterality: acute illness or injury with systemic symptoms  Amount and/or Complexity of Data Reviewed Independent Historian: EMS    Details: hx External Data Reviewed: labs and notes. Labs: ordered. Decision-making details documented in ED Course. Radiology: ordered and independent interpretation performed. Decision-making details documented in ED Course. ECG/medicine tests: ordered and independent interpretation performed. Decision-making details documented in ED Course.  Risk OTC drugs. Prescription drug management. Decision regarding hospitalization.   Iv ns. Continuous pulse ox and cardiac monitoring. Labs ordered/sent. Imaging ordered.   Differential diagnosis includes  . Dispo decision including potential need for admission considered - will get labs and imaging and reassess.   Reviewed nursing notes and prior charts for additional history. External reports reviewed. Additional history from: EMS.   LR bolus. Zofran iv. Antivert po.   Cardiac monitor: sinus rhythm, rate 66.  Labs reviewed/interpreted by me - wbc elev, c/w prior. Hgb normal. K mildly low, kcl po. Ca low, ca po.   CT reviewed/interpreted by me - no hem. Pt w mild unsteady gait. Will get mri r/o post circ cva.   Mri reviewed/interpreted by me - no acute cva.   Recheck, pt feels improved. No recurrent nv. Dizziness improved. No lightheadedness. No pain. No headache. No cp. No abd pain.   Pt currently appears stable for d/c.   Rec close pcp and ENT f/u.  Return precautions provided.          Final Clinical Impression(s) / ED Diagnoses Final diagnoses:  None    Rx / DC Orders ED Discharge Orders     None         Cathren Laine, MD 09/27/22 2155

## 2022-09-27 NOTE — ED Triage Notes (Addendum)
Patient arrives by EMS with c/o dizziness.   Patient reports went out, had something to eat and then got home and started having dizziness.   Patient reports "felt like everything was spinning".  N/V associated with dizziness.  Patient also reports headache to left side of head.    Patient reports closing his eyes relieves his symptoms.   Patient given 4 mg Zofran IV by EMS with some relief to nausea.    EMS vitals:  BP 168/92  HR 68 RR 20 100 % RA CBG 142

## 2022-09-27 NOTE — ED Notes (Signed)
Discharge instructions discussed with pt. Verbalized understanding. VSS. No questions or concerns regarding discharge  

## 2022-09-27 NOTE — ED Notes (Signed)
Patient transported to MRI 

## 2022-09-27 NOTE — Discharge Instructions (Addendum)
It was our pleasure to provide your ER care today - we hope that you feel better.  Drink plenty of fluids/stay well hydrated. Take antivert as need for dizziness - no driving when taking antivert or when feeling dizzy.   From today's labs, your potassium level is mildly low - eat plenty of fruits and vegetables, take potassium supplement as prescribed, and follow up with your doctor in 1-2 weeks. Your calcium level is also low, get adequate calcium in diet and/or take over  the counter calcium supplement, and follow up with your doctor.   If dizziness fails to resolve in the coming week, follow up closely with ENT doctor in 1-2 weeks - call office to arrange appointment.   Your blood pressure is high tonight - follow up closely with primary care doctor in the next 1-2 weeks.   Return to ER if worse, new symptoms, fevers, new/severe pain, severe headache, severe dizziness, chest pain, trouble breathing, fainting, or other concern.

## 2022-11-14 ENCOUNTER — Encounter: Payer: Self-pay | Admitting: Family Medicine

## 2022-11-14 ENCOUNTER — Ambulatory Visit (INDEPENDENT_AMBULATORY_CARE_PROVIDER_SITE_OTHER): Payer: No Typology Code available for payment source | Admitting: Family Medicine

## 2022-11-14 VITALS — BP 150/80 | HR 68 | Temp 98.1°F | Ht 72.5 in | Wt 327.0 lb

## 2022-11-14 DIAGNOSIS — E66813 Obesity, class 3: Secondary | ICD-10-CM

## 2022-11-14 DIAGNOSIS — Z6841 Body Mass Index (BMI) 40.0 and over, adult: Secondary | ICD-10-CM

## 2022-11-14 DIAGNOSIS — Z114 Encounter for screening for human immunodeficiency virus [HIV]: Secondary | ICD-10-CM

## 2022-11-14 DIAGNOSIS — Z7689 Persons encountering health services in other specified circumstances: Secondary | ICD-10-CM

## 2022-11-14 DIAGNOSIS — I10 Essential (primary) hypertension: Secondary | ICD-10-CM | POA: Diagnosis not present

## 2022-11-14 DIAGNOSIS — Z1211 Encounter for screening for malignant neoplasm of colon: Secondary | ICD-10-CM | POA: Diagnosis not present

## 2022-11-14 DIAGNOSIS — Z1159 Encounter for screening for other viral diseases: Secondary | ICD-10-CM | POA: Diagnosis not present

## 2022-11-14 MED ORDER — OLMESARTAN MEDOXOMIL 20 MG PO TABS: 20.0000 mg | ORAL_TABLET | Freq: Every day | ORAL | 2 refills | Status: DC

## 2022-11-14 NOTE — Patient Instructions (Signed)
-  It was a pleasure to meet you and look forward to taking care of you. -START Olmesartan 20mg  tablet, take 1 tablet once a day. Recommend to monitor your blood pressure twice a day, once in the morning and once in the evening. Bring your readings back in 2 weeks.  -Ordered labs. Office will call with lab results.  -Provided information about hypertension and low sodium diet.  -Ordered cologuard.  -Follow up 2 weeks.

## 2022-11-14 NOTE — Progress Notes (Signed)
New Patient Office Visit  Subjective    Patient ID: Marcus Suarez, male    DOB: Nov 12, 1970  Age: 52 y.o. MRN: [id number]  CC:  Chief Complaint  Patient presents with   Establish Care    Pt reports he is in process applying for disability for his knee. They had advise him to find PCP to take care of his BP. Pt reports his BP was 170/100.     HPI Marcus Suarez presents to establish care with new provider.   Patients previous primary care provider: None   Specialist: None   HTN: New onset. Patient was seen at [facility] ED at [facility] on 09/27/2022 for dizziness, peripheral vertigo, elevated BP, hypokalemia, and hypocalcemia. Patient had labs with an elevated WBC, potassium mildly low, and calcium low; CT head and MRI head with no acute CVA noted; He was prescribed Meclizine and potassium chloride. He reports his dizziness and peripheral vertigo have resolved. He was advised to follow up with a PCP. As for blood pressure, it remains elevated. Blood pressure is monitored "every now and then". Last Friday, he was seen by a provider for disability, his blood pressure was 170/100. Denies CP, SHOB, HA, dizziness, or lightheadedness. Patient reports he has left leg extremity edema that is intermittent, but started having surgery on left lower extremity.   Outpatient Encounter Medications as of 11/14/2022  Medication Sig   Acetaminophen (TYLENOL PO) Take by mouth.   IBUPROFEN PO Take by mouth as needed.   olmesartan (BENICAR) 20 MG tablet Take 1 tablet (20 mg total) by mouth daily for 90 doses.   [DISCONTINUED] meclizine (ANTIVERT) 12.5 MG tablet Take 1 tablet (12.5 mg total) by mouth 3 (three) times daily as needed for dizziness. (Patient not taking: Reported on 11/14/2022)   [DISCONTINUED] potassium chloride SA (KLOR-CON M) 20 MEQ tablet Take one po bid x 2 days, then take one po once a day (Patient not taking: Reported on 11/14/2022)   No facility-administered encounter medications on file  as of 11/14/2022.    Past Medical History:  Diagnosis Date   Arthritis    Chicken pox    High blood pressure     Past Surgical History:  Procedure Laterality Date   KNEE ARTHROSCOPY Left 2009   LAPAROSCOPIC APPENDECTOMY N/A 11/21/2020   Procedure: APPENDECTOMY LAPAROSCOPIC;  Surgeon: Abigail Miyamoto, MD;  Location: [facility];  Service: General;  Laterality: N/A;   LEG SURGERY Left 2005   calf surgery, tumor removed    Family History  Problem Relation Age of Onset   Osteoarthritis Father    Heart failure Daughter    Alcohol abuse Maternal Grandfather    Heart Problems Paternal Grandfather     Social History   Socioeconomic History   Marital status: Married    Spouse name: Not on file   Number of children: 5   Years of education: Not on file   Highest education level: High school graduate  Occupational History   Not on file  Tobacco Use   Smoking status: Former    Current packs/day: 0.00    Types: Cigarettes    Quit date: 1996    Years since quitting: 28.8   Smokeless tobacco: Never   Tobacco comments:    Pt reports he smoked cigarettes for 3 years.   Vaping Use   Vaping status: Never Used  Substance and Sexual Activity   Alcohol use: Not Currently   Drug use: Yes    Types: Marijuana  Comment: Daily   Sexual activity: Yes    Birth control/protection: None  Other Topics Concern   Not on file  Social History Narrative   Not on file   Social Determinants of Health   Financial Resource Strain: Low Risk  (11/14/2022)   Overall Financial Resource Strain (CARDIA)    Difficulty of Paying Living Expenses: Not hard at all  Food Insecurity: No Food Insecurity (11/14/2022)   Hunger Vital Sign    Worried About Running Out of Food in the Last Year: Never true    Ran Out of Food in the Last Year: Never true  Transportation Needs: No Transportation Needs (11/14/2022)   PRAPARE - Administrator, Civil Service (Medical): No    Lack of Transportation  (Non-Medical): No  Physical Activity: Inactive (11/14/2022)   Exercise Vital Sign    Days of Exercise per Week: 0 days    Minutes of Exercise per Session: 0 min  Stress: No Stress Concern Present (11/14/2022)   [employer] of Occupational Health - Occupational Stress Questionnaire    Feeling of Stress : Not at all  Social Connections: Moderately Isolated (11/14/2022)   Social Connection and Isolation Panel [NHANES]    Frequency of Communication with Friends and Family: More than three times a week    Frequency of Social Gatherings with Friends and Family: More than three times a week    Attends Religious Services: Never    Database administrator or Organizations: No    Attends Banker Meetings: Never    Marital Status: Married  Catering manager Violence: Not At Risk (11/14/2022)   Humiliation, Afraid, Rape, and Kick questionnaire    Fear of Current or Ex-Partner: No    Emotionally Abused: No    Physically Abused: No    Sexually Abused: No    ROS See HPI above    Objective   BP (!) 150/80 (BP Location: Left Arm, Patient Position: Sitting, Cuff Size: Large)   Pulse 68   Temp 98.1 F (36.7 C) (Oral)   Ht 6' 0.5" (1.842 m)   Wt (!) 327 lb (148.3 kg)   SpO2 97%   BMI 43.74 kg/m   Physical Exam Vitals reviewed.  Constitutional:      General: He is not in acute distress.    Appearance: Normal appearance. He is obese. He is not ill-appearing, toxic-appearing or diaphoretic.  HENT:     Head: Normocephalic and atraumatic.  Eyes:     General:        Right eye: No discharge.        Left eye: No discharge.     Conjunctiva/sclera: Conjunctivae normal.  Cardiovascular:     Rate and Rhythm: Normal rate and regular rhythm.     Heart sounds: Normal heart sounds. No murmur heard.    No friction rub. No gallop.  Pulmonary:     Effort: Pulmonary effort is normal. No respiratory distress.     Breath sounds: Normal breath sounds.  Musculoskeletal:        General:  Normal range of motion.  Skin:    General: Skin is warm and dry.  Neurological:     General: No focal deficit present.     Mental Status: He is alert and oriented to person, place, and time. Mental status is at baseline.  Psychiatric:        Mood and Affect: Mood normal.        Behavior: Behavior normal.  Thought Content: Thought content normal.        Judgment: Judgment normal.      Assessment & Plan:  Primary hypertension Assessment & Plan: Uncontrolled. Prescribed Olmesartan 20mg  daily. Recommend to monitor his blood pressure BID with an upper arm blood pressure cuff and bring reading to his next visit. Provided written information about HTN, how to obtain his BP, and DASH diet. Ordered CMP to assess kidney function.   Orders: -     Olmesartan Medoxomil; Take 1 tablet (20 mg total) by mouth daily for 90 doses.  Dispense: 30 tablet; Refill: 2 -     Comprehensive metabolic panel; Future  Class 3 severe obesity due to excess calories with serious comorbidity and body mass index (BMI) of 40.0 to 44.9 in adult (HCC) -     CBC with Differential/Platelet; Future -     Comprehensive metabolic panel; Future -     Hemoglobin A1c; Future -     Lipid panel; Future -     TSH; Future  Colon cancer screening -     Cologuard  Need for hepatitis C screening test -     Hepatitis C antibody; Future  Encounter for screening for HIV -     HIV Antibody (routine testing w rflx); Future  Encounter to establish care   1.Review health maintenance:  -Cologuard: Ordered for colon/rectal screening -Covid booster: Declines  -Tdap: Not available, but will obtain at next visit  -Hep C and HIV screening: Ordered screening labs  -Influenza vaccine: Declines -Zoster vaccine: Hold, will do shingles later 2.Ordered labs based on BMI: CBC with Diff, CMP, A1c, Lipids, and TSH.  Return in about 2 weeks (around 11/28/2022) for follow-up.   Zandra Abts, NP

## 2022-11-14 NOTE — Assessment & Plan Note (Signed)
Uncontrolled. Prescribed Olmesartan 20mg  daily. Recommend to monitor his blood pressure BID with an upper arm blood pressure cuff and bring reading to his next visit. Provided written information about HTN, how to obtain his BP, and DASH diet. Ordered CMP to assess kidney function.

## 2022-11-18 ENCOUNTER — Other Ambulatory Visit (INDEPENDENT_AMBULATORY_CARE_PROVIDER_SITE_OTHER): Payer: No Typology Code available for payment source

## 2022-11-18 DIAGNOSIS — Z6841 Body Mass Index (BMI) 40.0 and over, adult: Secondary | ICD-10-CM

## 2022-11-18 DIAGNOSIS — I10 Essential (primary) hypertension: Secondary | ICD-10-CM

## 2022-11-18 DIAGNOSIS — E66813 Obesity, class 3: Secondary | ICD-10-CM | POA: Diagnosis not present

## 2022-11-18 DIAGNOSIS — Z1159 Encounter for screening for other viral diseases: Secondary | ICD-10-CM

## 2022-11-18 DIAGNOSIS — Z114 Encounter for screening for human immunodeficiency virus [HIV]: Secondary | ICD-10-CM

## 2022-11-18 LAB — CBC WITH DIFFERENTIAL/PLATELET
Basophils Absolute: 0 10*3/uL (ref 0.0–0.1)
Basophils Relative: 0.5 % (ref 0.0–3.0)
Eosinophils Absolute: 0.1 10*3/uL (ref 0.0–0.7)
Eosinophils Relative: 1 % (ref 0.0–5.0)
HCT: 42.9 % (ref 39.0–52.0)
Hemoglobin: 13.5 g/dL (ref 13.0–17.0)
Lymphocytes Relative: 28.2 % (ref 12.0–46.0)
Lymphs Abs: 2.3 10*3/uL (ref 0.7–4.0)
MCHC: 31.6 g/dL (ref 30.0–36.0)
MCV: 87.1 fL (ref 78.0–100.0)
Monocytes Absolute: 0.6 10*3/uL (ref 0.1–1.0)
Monocytes Relative: 7 % (ref 3.0–12.0)
Neutro Abs: 5.1 10*3/uL (ref 1.4–7.7)
Neutrophils Relative %: 63.3 % (ref 43.0–77.0)
Platelets: 224 10*3/uL (ref 150.0–400.0)
RBC: 4.93 Mil/uL (ref 4.22–5.81)
RDW: 13.3 % (ref 11.5–15.5)
WBC: 8 10*3/uL (ref 4.0–10.5)

## 2022-11-18 LAB — COMPREHENSIVE METABOLIC PANEL
ALT: 17 U/L (ref 0–53)
AST: 15 U/L (ref 0–37)
Albumin: 4.2 g/dL (ref 3.5–5.2)
Alkaline Phosphatase: 72 U/L (ref 39–117)
BUN: 8 mg/dL (ref 6–23)
CO2: 29 meq/L (ref 19–32)
Calcium: 9.1 mg/dL (ref 8.4–10.5)
Chloride: 106 meq/L (ref 96–112)
Creatinine, Ser: 0.81 mg/dL (ref 0.40–1.50)
GFR: 102.41 mL/min (ref >60.00)
Glucose, Bld: 108 mg/dL — ABNORMAL HIGH (ref 70–99)
Potassium: 3.8 meq/L (ref 3.5–5.1)
Sodium: 140 meq/L (ref 135–145)
Total Bilirubin: 0.6 mg/dL (ref 0.2–1.2)
Total Protein: 6.9 g/dL (ref 6.0–8.3)

## 2022-11-18 LAB — LIPID PANEL
Cholesterol: 135 mg/dL (ref 0–200)
HDL: 25 mg/dL — ABNORMAL LOW (ref >39.00)
LDL Cholesterol: 77 mg/dL (ref 0–99)
NonHDL: 110.41
Total CHOL/HDL Ratio: 5
Triglycerides: 165 mg/dL — ABNORMAL HIGH (ref 0.0–149.0)
VLDL: 33 mg/dL (ref 0.0–40.0)

## 2022-11-18 LAB — HEMOGLOBIN A1C: Hgb A1c MFr Bld: 5 % (ref 4.6–6.5)

## 2022-11-18 LAB — TSH: TSH: 1.43 u[IU]/mL (ref 0.35–5.50)

## 2022-11-19 ENCOUNTER — Other Ambulatory Visit: Payer: Self-pay

## 2022-11-19 DIAGNOSIS — E785 Hyperlipidemia, unspecified: Secondary | ICD-10-CM

## 2022-11-19 LAB — HIV ANTIBODY (ROUTINE TESTING W REFLEX): HIV 1&2 Ab, 4th Generation: NONREACTIVE

## 2022-11-19 LAB — HEPATITIS C ANTIBODY: Hepatitis C Ab: NONREACTIVE

## 2022-11-19 MED ORDER — ROSUVASTATIN CALCIUM 10 MG PO TABS
10.0000 mg | ORAL_TABLET | Freq: Every day | ORAL | 0 refills | Status: DC
Start: 2022-11-19 — End: 2022-11-20

## 2022-11-20 ENCOUNTER — Telehealth: Payer: Self-pay | Admitting: Family Medicine

## 2022-11-20 DIAGNOSIS — E785 Hyperlipidemia, unspecified: Secondary | ICD-10-CM

## 2022-11-20 MED ORDER — SIMVASTATIN 20 MG PO TABS: 20.0000 mg | ORAL_TABLET | Freq: Every day | ORAL | 0 refills | Status: DC

## 2022-11-20 NOTE — Telephone Encounter (Signed)
Rx sent and patient is aware. 

## 2022-11-20 NOTE — Telephone Encounter (Signed)
Pt says he is being charged $245 for rosuvastatin (CRESTOR) 10 MG tablet requesting something more cost effective

## 2022-11-20 NOTE — Telephone Encounter (Signed)
I spoke with Pharmacist at [facility] and they stated the patient has a high deductible with is why Crestor is $245. Pharmacist reported that either Simvastatin or Lovastatin will be more cost effective.

## 2022-11-20 NOTE — Addendum Note (Signed)
Addended by: Christy Sartorius on: 11/20/2022 01:20 PM   Modules accepted: Orders

## 2022-11-28 ENCOUNTER — Ambulatory Visit (INDEPENDENT_AMBULATORY_CARE_PROVIDER_SITE_OTHER): Payer: No Typology Code available for payment source | Admitting: Family Medicine

## 2022-11-28 ENCOUNTER — Encounter: Payer: Self-pay | Admitting: Family Medicine

## 2022-11-28 VITALS — BP 164/86 | HR 63 | Temp 98.0°F | Ht 72.5 in | Wt 328.2 lb

## 2022-11-28 DIAGNOSIS — E785 Hyperlipidemia, unspecified: Secondary | ICD-10-CM | POA: Diagnosis not present

## 2022-11-28 DIAGNOSIS — I10 Essential (primary) hypertension: Secondary | ICD-10-CM | POA: Diagnosis not present

## 2022-11-28 MED ORDER — OLMESARTAN MEDOXOMIL 40 MG PO TABS: 40.0000 mg | ORAL_TABLET | Freq: Every day | ORAL | 0 refills | Status: DC

## 2022-11-28 NOTE — Assessment & Plan Note (Signed)
Doing good with starting Simvastatin. Recommend a lab visit in 6 weeks to assess liver enzymes with starting statin therapy.

## 2022-11-28 NOTE — Progress Notes (Signed)
Established Patient Office Visit   Subjective:  Patient ID: Marcus Suarez, male    DOB: [birth year]  Age: 52 y.o. MRN: [id number]  Chief Complaint  Patient presents with   Medical Management of Chronic Issues    Pt is here for his follow up  on BP and cholesterol. Pt reports he did check his BP at home but did not record it. Last BP check was 168/91. He continues that has cut down his fried food and eating more fruits and vegetables.    HPI HTN: Chronic. Patient was started on Olmesartan 20mg  daily. He was recommend to monitor his BP BID for the next two weeks. He didn't bring his readings but reports his previous BP at home was 168/91. Denies any CP, SHOB, HA, dizziness, lightheadedness, or dizziness.   Hyperlipidemia: Since previous visit, patient has started taking Simvastatin 20mg  daily for elevated lipids and ASCVD risk score of 13.4. He was initially prescribed Rosuvastatin, but the cost of medication was too expensive for patient. Changed to Simvastatin. He needs his liver enzymes checked in 6 weeks. CMP order is already ordered, just needs a lab appointment. Denies muscle cramps or joint pain. Denies abd pain nausea, or vomiting.  ROS See HPI above     Objective:   BP (!) 164/86 (BP Location: Right Arm, Patient Position: Sitting, Cuff Size: Large)   Pulse 63   Temp 98 F (36.7 C) (Oral)   Ht 6' 0.5" (1.842 m)   Wt (!) 328 lb 3.2 oz (148.9 kg)   SpO2 95%   BMI 43.90 kg/m    Physical Exam Vitals reviewed.  Constitutional:      General: He is not in acute distress.    Appearance: Normal appearance. He is obese. He is not ill-appearing, toxic-appearing or diaphoretic.  Eyes:     General:        Right eye: No discharge.        Left eye: No discharge.     Conjunctiva/sclera: Conjunctivae normal.  Cardiovascular:     Rate and Rhythm: Normal rate.  Pulmonary:     Effort: Pulmonary effort is normal. No respiratory distress.  Musculoskeletal:        General: Normal  range of motion.  Skin:    General: Skin is warm and dry.  Neurological:     General: No focal deficit present.     Mental Status: He is alert and oriented to person, place, and time. Mental status is at baseline.  Psychiatric:        Mood and Affect: Mood normal.        Behavior: Behavior normal.        Thought Content: Thought content normal.        Judgment: Judgment normal.      Assessment & Plan:  Primary hypertension Assessment & Plan: Blood pressure is still elevated. Increase Olmesartan to 40mg  daily from 20mg  daily. Advised he can take 2 tablets of the 20mg  to equal 40mg  total. Recommend to monitor his blood pressure twice a day, write them down, and bring to his next appointment in 2 weeks. Provided a BP log form to write down readings.   Orders: -     Olmesartan Medoxomil; Take 1 tablet (40 mg total) by mouth daily.  Dispense: 90 tablet; Refill: 0  Hyperlipidemia, unspecified hyperlipidemia type Assessment & Plan: Doing good with starting Simvastatin. Recommend a lab visit in 6 weeks to assess liver enzymes with starting statin therapy.  Return in about 2 weeks (around 12/12/2022) for follow-up; lab appointment 6 weeks-CMP.   Zandra Abts, NP

## 2022-11-28 NOTE — Patient Instructions (Addendum)
-  Blood pressure is still elevated. Increase Olmesartan to 40mg  daily from 20mg  daily. You can take 2 tablets of the 20mg  to equal 40mg  total. Recommend to monitor your blood pressure twice a day, write them down, and bring to your next appointment in 2 weeks.  -Follow up in 2 weeks.  -Needs lab visit in 6 weeks.

## 2022-11-28 NOTE — Assessment & Plan Note (Signed)
Blood pressure is still elevated. Increase Olmesartan to 40mg  daily from 20mg  daily. Advised he can take 2 tablets of the 20mg  to equal 40mg  total. Recommend to monitor his blood pressure twice a day, write them down, and bring to his next appointment in 2 weeks. Provided a BP log form to write down readings.

## 2022-12-07 LAB — COLOGUARD: COLOGUARD: POSITIVE — AB

## 2022-12-08 ENCOUNTER — Other Ambulatory Visit: Payer: Self-pay

## 2022-12-08 DIAGNOSIS — R195 Other fecal abnormalities: Secondary | ICD-10-CM

## 2022-12-22 ENCOUNTER — Encounter (HOSPITAL_COMMUNITY): Payer: Self-pay

## 2022-12-22 ENCOUNTER — Ambulatory Visit (INDEPENDENT_AMBULATORY_CARE_PROVIDER_SITE_OTHER): Payer: No Typology Code available for payment source

## 2022-12-22 ENCOUNTER — Ambulatory Visit (INDEPENDENT_AMBULATORY_CARE_PROVIDER_SITE_OTHER): Payer: No Typology Code available for payment source | Admitting: Family Medicine

## 2022-12-22 ENCOUNTER — Ambulatory Visit (HOSPITAL_COMMUNITY)
Admission: EM | Admit: 2022-12-22 | Discharge: 2022-12-22 | Disposition: A | Discharge status: Home / Self Care | Payer: No Typology Code available for payment source | Attending: Family Medicine | Admitting: Family Medicine

## 2022-12-22 DIAGNOSIS — M25561 Pain in right knee: Secondary | ICD-10-CM

## 2022-12-22 DIAGNOSIS — M545 Low back pain, unspecified: Secondary | ICD-10-CM | POA: Diagnosis not present

## 2022-12-22 MED ORDER — IBUPROFEN 800 MG PO TABS
ORAL_TABLET | ORAL | Status: AC
  Filled 2022-12-22: qty 1

## 2022-12-22 MED ORDER — IBUPROFEN 800 MG PO TABS
800.0000 mg | ORAL_TABLET | Freq: Once | ORAL | Status: AC
  Administered 2022-12-22: 800 mg via ORAL

## 2022-12-22 MED ORDER — TIZANIDINE HCL 4 MG PO TABS: 4.0000 mg | ORAL_TABLET | Freq: Three times a day (TID) | ORAL | 0 refills | Status: DC | PRN
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
## 2023-01-09 ENCOUNTER — Other Ambulatory Visit: Payer: No Typology Code available for payment source

## 2023-01-20 ENCOUNTER — Other Ambulatory Visit: Payer: Medicaid Other

## 2023-01-27 ENCOUNTER — Other Ambulatory Visit (INDEPENDENT_AMBULATORY_CARE_PROVIDER_SITE_OTHER): Payer: Medicaid Other

## 2023-01-27 DIAGNOSIS — E785 Hyperlipidemia, unspecified: Secondary | ICD-10-CM | POA: Diagnosis not present

## 2023-01-27 LAB — COMPREHENSIVE METABOLIC PANEL
ALT: 20 U/L (ref 0–53)
AST: 19 U/L (ref 0–37)
Albumin: 4.3 g/dL (ref 3.5–5.2)
Alkaline Phosphatase: 72 U/L (ref 39–117)
BUN: 8 mg/dL (ref 6–23)
CO2: 25 meq/L (ref 19–32)
Calcium: 8.9 mg/dL (ref 8.4–10.5)
Chloride: 106 meq/L (ref 96–112)
Creatinine, Ser: 0.77 mg/dL (ref 0.40–1.50)
GFR: 103.84 mL/min (ref >60.00)
Glucose, Bld: 110 mg/dL — ABNORMAL HIGH (ref 70–99)
Potassium: 3.5 meq/L (ref 3.5–5.1)
Sodium: 139 meq/L (ref 135–145)
Total Bilirubin: 0.5 mg/dL (ref 0.2–1.2)
Total Protein: 7 g/dL (ref 6.0–8.3)

## 2023-02-16 ENCOUNTER — Encounter: Payer: Self-pay | Admitting: Family Medicine

## 2023-02-18 ENCOUNTER — Encounter: Payer: Self-pay | Admitting: Family Medicine

## 2023-02-18 ENCOUNTER — Ambulatory Visit (INDEPENDENT_AMBULATORY_CARE_PROVIDER_SITE_OTHER): Payer: Medicaid Other | Admitting: Family Medicine

## 2023-02-18 DIAGNOSIS — I10 Essential (primary) hypertension: Secondary | ICD-10-CM | POA: Diagnosis not present

## 2023-02-18 DIAGNOSIS — E785 Hyperlipidemia, unspecified: Secondary | ICD-10-CM

## 2023-02-18 MED ORDER — AMLODIPINE BESYLATE 5 MG PO TABS: 5.0000 mg | ORAL_TABLET | Freq: Every day | ORAL | 0 refills | Status: DC

## 2023-02-18 MED ORDER — OLMESARTAN MEDOXOMIL 40 MG PO TABS: 40.0000 mg | ORAL_TABLET | Freq: Every day | ORAL | 0 refills | Status: DC

## 2023-02-18 MED ORDER — SIMVASTATIN 20 MG PO TABS: 20.0000 mg | ORAL_TABLET | Freq: Every day | ORAL | 0 refills | Status: DC

## 2023-02-18 NOTE — Assessment & Plan Note (Signed)
 Blood pressure is still elevated with only taking Olmesartan  40mg  daily. Will add Amlodipine  5mg  daily. Refilled Olmesartan . Recommend to monitor his blood pressure and bring in readings to his next appointment.

## 2023-02-18 NOTE — Assessment & Plan Note (Signed)
 Continue Simvastatin . Refilled medication. At next appointment, he is due to have a lipid panel drawn. Informed patient to be fasting.

## 2023-02-18 NOTE — Patient Instructions (Addendum)
-  Continue to take Olmesartan  40mg  daily. Please add Amlodipine  5mg  daily. Monitor your blood pressure twice daily and bring readings in 2 weeks. Refilled Olmesartan .  -Continue Simvastatin . Refilled medication. Please be fasting at your next appointment and will dray labs.  -Follow up in 2 weeks.

## 2023-02-18 NOTE — Progress Notes (Signed)
 Established Patient Office Visit   Subjective:  Patient ID: Marcus Suarez, male    DOB: [birth year]  Age: 53 y.o. MRN: [id number]  Chief Complaint  Patient presents with   Follow-up    HPI HTN: Chronic. Patient is taking Olmesartan  40mg  daily. Medication was increased back in November to 40mg  from 20mg . He reports he has been taking his medication. Denies any CP, HA, dizziness, lightheaded, and SHOB. He reports he is monitoring his blood pressure every day. Reports lowest 148/75 and highest BPs have been at his appointments. BP Readings from Last 3 Encounters:  02/18/23 (!) 164/89  12/22/22 (!) 147/84  11/28/22 (!) 164/86     Hyperlipidemia: Chronic. Patient is taking Simvastatin  20mg . Liver function was within normal limits in January. Denies any side effects from medication.  Lab Results  Component Value Date   CHOL 135 11/18/2022   HDL 25.00 (L) 11/18/2022   LDLCALC 77 11/18/2022   TRIG 165.0 (H) 11/18/2022   CHOLHDL 5 11/18/2022     ROS See HPI above     Objective:   BP (!) 164/89 (BP Location: Left Arm, Patient Position: Sitting, Cuff Size: Large) Comment: automatic machine  Pulse 78   Temp 98.1 F (36.7 C) (Oral)   Ht 6' 2.5 (1.892 m)   Wt (!) 333 lb 9.6 oz (151.3 kg)   SpO2 96%   BMI 42.26 kg/m    Physical Exam Vitals reviewed.  Constitutional:      General: He is not in acute distress.    Appearance: Normal appearance. He is obese. He is not ill-appearing, toxic-appearing or diaphoretic.  HENT:     Head: Normocephalic and atraumatic.  Eyes:     General:        Right eye: No discharge.        Left eye: No discharge.     Conjunctiva/sclera: Conjunctivae normal.  Cardiovascular:     Rate and Rhythm: Normal rate.  Pulmonary:     Effort: Pulmonary effort is normal. No respiratory distress.  Musculoskeletal:        General: Normal range of motion.  Skin:    General: Skin is warm and dry.  Neurological:     General: No focal deficit present.      Mental Status: He is alert and oriented to person, place, and time. Mental status is at baseline.  Psychiatric:        Mood and Affect: Mood normal.        Behavior: Behavior normal.        Thought Content: Thought content normal.        Judgment: Judgment normal.    The 10-year ASCVD risk score (Arnett DK, et al., 2019) is: 15.7%    Assessment & Plan:  Primary hypertension Assessment & Plan: Blood pressure is still elevated with only taking Olmesartan  40mg  daily. Will add Amlodipine  5mg  daily. Refilled Olmesartan . Recommend to monitor his blood pressure and bring in readings to his next appointment.   Orders: -     Olmesartan  Medoxomil; Take 1 tablet (40 mg total) by mouth daily.  Dispense: 90 tablet; Refill: 0 -     amLODIPine  Besylate; Take 1 tablet (5 mg total) by mouth daily.  Dispense: 90 tablet; Refill: 0  Hyperlipidemia, unspecified hyperlipidemia type Assessment & Plan: Continue Simvastatin . Refilled medication. At next appointment, he is due to have a lipid panel drawn. Informed patient to be fasting.   Orders: -     Simvastatin ; Take 1 tablet (20  mg total) by mouth at bedtime.  Dispense: 90 tablet; Refill: 0  1.Review health maintenance:  -Tdap vaccine: Declines -Zoster vaccine: Declines -Covid vaccine: Declines -Influenza vaccine: Declines Discussed that if patient wanted any of these vaccines, he can obtain them at his local pharmacy or health department.  -Patient has an appointment on 03/04 with GI. He had a positive cologuard.   Return in about 2 weeks (around 03/04/2023) for follow-up-fasting .   Samira Acero, NP

## 2023-03-02 ENCOUNTER — Other Ambulatory Visit: Payer: Self-pay | Admitting: Family Medicine

## 2023-03-02 DIAGNOSIS — I10 Essential (primary) hypertension: Secondary | ICD-10-CM

## 2023-03-04 ENCOUNTER — Ambulatory Visit: Payer: Medicaid Other | Admitting: Family Medicine

## 2023-03-11 ENCOUNTER — Ambulatory Visit: Payer: Medicaid Other | Admitting: Family Medicine

## 2023-03-11 NOTE — Progress Notes (Deleted)
   Established Patient Office Visit   Subjective:  Patient ID: Boyd Buffalo, male    DOB: November 25, 1970  Age: 53 y.o. MRN: [id number]  No chief complaint on file.   HPI HTN: Chronic. On last visit, added Amlodipine 5mg  daily to Olmesartan 40mg  daily.   Hyperlipidemia: Chronic. Patient is taking Simvastatin 20mg . He has been on the medication a little more than 3 months and needs to have his lipids re-evaluated.  Lab Results  Component Value Date   CHOL 135 11/18/2022   HDL 25.00 (L) 11/18/2022   LDLCALC 77 11/18/2022   TRIG 165.0 (H) 11/18/2022   CHOLHDL 5 11/18/2022    ROS See HPI above     Objective:     There were no vitals taken for this visit. {Vitals History (Optional):23777}  Physical Exam  No results found for any visits on 03/11/23.  The 10-year ASCVD risk score (Arnett DK, et al., 2019) is: 15.7%    Assessment & Plan:  There are no diagnoses linked to this encounter.  No follow-ups on file.   Zandra Abts, NP

## 2023-03-17 ENCOUNTER — Ambulatory Visit: Payer: No Typology Code available for payment source | Admitting: Gastroenterology

## 2023-03-31 ENCOUNTER — Ambulatory Visit: Admitting: Family Medicine

## 2023-04-02 ENCOUNTER — Encounter: Payer: Self-pay | Admitting: Family Medicine

## 2023-04-02 ENCOUNTER — Ambulatory Visit (INDEPENDENT_AMBULATORY_CARE_PROVIDER_SITE_OTHER): Admitting: Family Medicine

## 2023-04-02 VITALS — BP 160/76 | HR 84 | Temp 97.8°F | Wt 334.0 lb

## 2023-04-02 DIAGNOSIS — E785 Hyperlipidemia, unspecified: Secondary | ICD-10-CM

## 2023-04-02 DIAGNOSIS — I10 Essential (primary) hypertension: Secondary | ICD-10-CM

## 2023-04-02 MED ORDER — SIMVASTATIN 20 MG PO TABS: 20.0000 mg | ORAL_TABLET | Freq: Every day | ORAL | 0 refills | Status: AC

## 2023-04-02 MED ORDER — AMLODIPINE BESYLATE 5 MG PO TABS: 5.0000 mg | ORAL_TABLET | Freq: Every day | ORAL | 0 refills | Status: DC

## 2023-04-02 NOTE — Assessment & Plan Note (Signed)
 Stable. Continue and refilled Simvastatin. Recommend patient be fasting at next appointment to having lipid panel rechecked and CMP.

## 2023-04-02 NOTE — Progress Notes (Signed)
   Established Patient Office Visit   Subjective:  Patient ID: Marcus Suarez, male    DOB: January 11, 1971  Age: 53 y.o. MRN: [id number]  Chief Complaint  Patient presents with   Medical Management of Chronic Issues    HPI HTN: Chronic. Patient is taking Olmesartan 40mg  daily and Amlodipine 5mg  daily. Amlodipine was added at last appointment on 02/18/2023.  Denies any CP, HA, dizziness, lightheaded, and SHOB. Patient has not picked up the Amlodipine due to complications with insurance. But, taking Olmesartan.   Hyperlipidemia: Chronic. Patient is taking Simvastatin 20mg . Liver function was within normal limits in January. Denies any side effects from medication. Needs refill on medication.  ROS See HPI above     Objective:   BP (!) 160/76 (BP Location: Left Arm, Patient Position: Sitting, Cuff Size: Large)   Pulse 84   Temp 97.8 F (36.6 C) (Oral)   Wt (!) 334 lb (151.5 kg)   SpO2 98%   BMI 42.31 kg/m    Physical Exam Vitals reviewed.  Constitutional:      General: He is not in acute distress.    Appearance: Normal appearance. He is obese. He is not ill-appearing, toxic-appearing or diaphoretic.  HENT:     Head: Normocephalic and atraumatic.  Eyes:     General:        Right eye: No discharge.        Left eye: No discharge.     Conjunctiva/sclera: Conjunctivae normal.  Cardiovascular:     Rate and Rhythm: Normal rate and regular rhythm.     Heart sounds: Normal heart sounds. No murmur heard.    No friction rub. No gallop.  Pulmonary:     Effort: Pulmonary effort is normal. No respiratory distress.     Breath sounds: Normal breath sounds.  Musculoskeletal:        General: Normal range of motion.  Skin:    General: Skin is warm and dry.  Neurological:     General: No focal deficit present.     Mental Status: He is alert and oriented to person, place, and time. Mental status is at baseline.  Psychiatric:        Mood and Affect: Mood normal.        Behavior:  Behavior normal.        Thought Content: Thought content normal.        Judgment: Judgment normal.      Assessment & Plan:  Primary hypertension Assessment & Plan: Blood pressure still elevated. Truly needs Amlodipine or another blood pressure medication besides Olmesartan. Refilled Amlodipine. Advised patient to please call the office or send a MyChart message if he is unable to get Amlodipine. Recommend to monitor his blood pressure twice daily and bring his reading to his next appointment.    Orders: -     amLODIPine Besylate; Take 1 tablet (5 mg total) by mouth daily.  Dispense: 90 tablet; Refill: 0  Hyperlipidemia, unspecified hyperlipidemia type Assessment & Plan: Stable. Continue and refilled Simvastatin. Recommend patient be fasting at next appointment to having lipid panel rechecked and CMP.     Orders: -     Simvastatin; Take 1 tablet (20 mg total) by mouth at bedtime.  Dispense: 90 tablet; Refill: 0   Return in about 2 weeks (around 04/16/2023) for follow-up fasting .   Zandra Abts, NP

## 2023-04-02 NOTE — Assessment & Plan Note (Signed)
 Blood pressure still elevated. Truly needs Amlodipine or another blood pressure medication besides Olmesartan. Refilled Amlodipine. Advised patient to please call the office or send a MyChart message if he is unable to get Amlodipine. Recommend to monitor his blood pressure twice daily and bring his reading to his next appointment.

## 2023-04-02 NOTE — Patient Instructions (Signed)
-  Blood pressure still elevated. Truly needs Amlodipine or another blood pressure medication besides Olmesartan. Refilled Amlodipine. Please call the office or send a MyChart message if you are unable to get Amlodipine. Recommend to monitor your blood pressure twice daily and bring your reading to your next appointment.  -Refilled Simvastatin for cholesterol. Please be fasting at next appointment for labs to be drawn.  -Follow up in 2 weeks.

## 2023-04-07 ENCOUNTER — Other Ambulatory Visit: Payer: Self-pay | Admitting: Family Medicine

## 2023-04-07 DIAGNOSIS — I10 Essential (primary) hypertension: Secondary | ICD-10-CM

## 2023-04-16 ENCOUNTER — Ambulatory Visit: Admitting: Family Medicine

## 2023-04-16 VITALS — BP 152/80 | HR 67 | Temp 97.9°F | Ht 74.0 in | Wt 331.0 lb

## 2023-04-16 DIAGNOSIS — M25552 Pain in left hip: Secondary | ICD-10-CM

## 2023-04-16 DIAGNOSIS — I10 Essential (primary) hypertension: Secondary | ICD-10-CM | POA: Diagnosis not present

## 2023-04-16 DIAGNOSIS — E785 Hyperlipidemia, unspecified: Secondary | ICD-10-CM

## 2023-04-16 LAB — COMPREHENSIVE METABOLIC PANEL WITH GFR
ALT: 22 U/L (ref 0–53)
AST: 18 U/L (ref 0–37)
Albumin: 4.5 g/dL (ref 3.5–5.2)
Alkaline Phosphatase: 81 U/L (ref 39–117)
BUN: 7 mg/dL (ref 6–23)
CO2: 30 meq/L (ref 19–32)
Calcium: 9.1 mg/dL (ref 8.4–10.5)
Chloride: 102 meq/L (ref 96–112)
Creatinine, Ser: 0.79 mg/dL (ref 0.40–1.50)
GFR: 102.89 mL/min (ref >60.00)
Glucose, Bld: 104 mg/dL — ABNORMAL HIGH (ref 70–99)
Potassium: 4.1 meq/L (ref 3.5–5.1)
Sodium: 139 meq/L (ref 135–145)
Total Bilirubin: 0.9 mg/dL (ref 0.2–1.2)
Total Protein: 7.2 g/dL (ref 6.0–8.3)

## 2023-04-16 LAB — LIPID PANEL
Cholesterol: 133 mg/dL (ref 0–200)
HDL: 28 mg/dL — ABNORMAL LOW (ref >39.00)
LDL Cholesterol: 72 mg/dL (ref 0–99)
NonHDL: 104.71
Total CHOL/HDL Ratio: 5
Triglycerides: 163 mg/dL — ABNORMAL HIGH (ref 0.0–149.0)
VLDL: 32.6 mg/dL (ref 0.0–40.0)

## 2023-04-16 NOTE — Assessment & Plan Note (Signed)
-  Blood pressure is still elevated. Suspect it is only elevated today because you have not had your blood pressure medication with fasting for labs. Recommend to continue with taking Olmesartan 40mg  and Amlodipine 5mg  daily. Follow up for a nurse visit in 1 week to check blood pressure to make sure it is controlled. Ordered CMP to assess kidney function.

## 2023-04-16 NOTE — Assessment & Plan Note (Signed)
 Ordered lipid and CMP to assess liver function.

## 2023-04-16 NOTE — Patient Instructions (Addendum)
-  Blood pressure is still elevated. Suspect it is only elevated today because you have not had your blood pressure medication with fasting for labs. Recommend to continue with taking Olmesartan 40mg  and Amlodipine 5mg  daily. Follow up for a nurse visit in 1 week to check blood pressure to make sure it is controlled.  -Ordered labs. Office will call with lab results and you will see them on MyChart.  -Ordered left hip x-ray for chronic pain. Office will call with results.  -Recommend to try Tylenol Arthritis or Advil Dual Action with Tylenol. Continue with heat or cold compresses to the area, 4-6 times a day, up to 20 minutes at a time.  -Follow up in 6 months for physical.

## 2023-04-16 NOTE — Progress Notes (Signed)
 Established Patient Office Visit   Subjective:  Patient ID: Marcus Suarez, male    DOB: [birth year]  Age: 53 y.o. MRN: [id number]  Chief Complaint  Patient presents with   Medical Management of Chronic Issues   Hypertension    HPI: HTN: On previous visit, 04/02/2023, added Amlodipine 5mg  daily to his Olmesartan 40mg  daily. Recommend to monitor his blood pressure BID for 2 weeks. However, patient failed to monitor his blood pressure. His blood pressure is elevated today, but he has not took his medication due to fasting for labs.   Patient is complaining of left hip pain. Started months ago. Intermittent, but daily pain. Described as a sharp pain. Denies pain radiating. Denies numbness and tingling on lower extremity. He has tried heat and cold compresses and stretches with no minimal relief. He reports he similar pain during high school when playing foot ball. Denies any recent injury.  ROS See HPI above     Objective:   BP (!) 152/80   Pulse 67   Temp 97.9 F (36.6 C) (Oral)   Ht 6\' 2"  (1.88 m)   Wt (!) 331 lb (150.1 kg)   SpO2 97%   BMI 42.50 kg/m  BP Readings from Last 3 Encounters:  04/16/23 (!) 152/80  04/02/23 (!) 160/76  02/18/23 (!) 164/89     Physical Exam Vitals reviewed.  Constitutional:      General: He is not in acute distress.    Appearance: Normal appearance. He is not ill-appearing, toxic-appearing or diaphoretic.  HENT:     Head: Normocephalic and atraumatic.  Eyes:     General:        Right eye: No discharge.        Left eye: No discharge.     Conjunctiva/sclera: Conjunctivae normal.  Cardiovascular:     Rate and Rhythm: Normal rate.  Pulmonary:     Effort: Pulmonary effort is normal. No respiratory distress.  Musculoskeletal:        General: Normal range of motion.  Skin:    General: Skin is warm and dry.  Neurological:     General: No focal deficit present.     Mental Status: He is alert and oriented to person, place, and time.  Mental status is at baseline.  Psychiatric:        Mood and Affect: Mood normal.        Behavior: Behavior normal.        Thought Content: Thought content normal.        Judgment: Judgment normal.      Assessment & Plan:  Primary hypertension Assessment & Plan: -Blood pressure is still elevated. Suspect it is only elevated today because you have not had your blood pressure medication with fasting for labs. Recommend to continue with taking Olmesartan 40mg  and Amlodipine 5mg  daily. Follow up for a nurse visit in 1 week to check blood pressure to make sure it is controlled. Ordered CMP to assess kidney function.   Orders: -     Comprehensive metabolic panel with GFR  Hyperlipidemia, unspecified hyperlipidemia type Assessment & Plan: Ordered lipid and CMP to assess liver function.   Orders: -     Comprehensive metabolic panel with GFR -     Lipid panel  Left hip pain -     DG HIP UNILAT W OR W/O PELVIS 2-3 VIEWS LEFT; Future  -Ordered left hip x-ray for chronic pain. Office will call with results.  -Discussed about possibly trying either a  Prednisone taper or daily NSAID. Patient would like to hold on taking either one of those medications. Recommend to try Tylenol Arthritis or Advil Dual Action with Tylenol. Continue with heat or cold compresses to the area, 4-6 times a day, up to 20 minutes at a time.   Return in about 6 months (around 10/16/2023) for physical; Nurse visit to check BP;.   Zandra Abts, NP

## 2023-04-17 ENCOUNTER — Encounter: Payer: Self-pay | Admitting: Family Medicine

## 2023-04-23 ENCOUNTER — Ambulatory Visit

## 2023-04-23 DIAGNOSIS — M25552 Pain in left hip: Secondary | ICD-10-CM | POA: Diagnosis not present

## 2023-04-23 NOTE — Progress Notes (Signed)
 Patient is in office today for a nurse visit for Blood Pressure Check. Patient blood pressure was 124/78, Patient has hypertension.Marcus Suarez

## 2023-04-24 ENCOUNTER — Ambulatory Visit: Payer: Self-pay | Admitting: Family Medicine

## 2023-04-24 NOTE — Telephone Encounter (Signed)
 Copied from CRM [id number]. Topic: Clinical - Red Word Triage >> Apr 24, 2023  3:17 PM Godfrey Pick wrote: Red Word that prompted transfer to Nurse Triage: severe stabbing pain in hip/thigh   Chief Complaint: Hip Pain Symptoms: sharp, stabbing pain radiates to thigh Frequency: constant Pertinent Negatives: Patient denies new injury Disposition: [] ED /[] Urgent Care (no appt availability in office) / [x] Appointment(In office/virtual)/ []  [facility]/ [] Home Care/ [] Refused Recommended Disposition /[] [facility]/ [x]  Follow-up with PCP Additional Notes: Patient states the had xrays done yesterday and wanted to follow-up on his discussion with PCP about getting steroid injections. Pt scheduled for Monday 4/14  Reason for Disposition  [1] MODERATE pain (e.g., interferes with normal activities, limping) AND [2] present > 3 days  Answer Assessment - Initial Assessment Questions 1. LOCATION and RADIATION: "Where is the pain located?"      Left side hip pain, and radiates down front of thigh  2. QUALITY: "What does the pain feel like?"  (e.g., sharp, dull, aching, burning)     Stabbing pain, sharp  3. SEVERITY: "How bad is the pain?" "What does it keep you from doing?"   (Scale 1-10; or mild, moderate, severe)   -  MILD (1-3): doesn't interfere with normal activities    -  MODERATE (4-7): interferes with normal activities (e.g., work or school) or awakens from sleep, limping    -  SEVERE (8-10): excruciating pain, unable to do any normal activities, unable to walk     Severe 10/10, able to walk but has to stop immediately when pain  4. ONSET: "When did the pain start?" "Does it come and go, or is it there all the time?"     Getting worse over last few weeks  5. WORK OR EXERCISE: "Has there been any recent work or exercise that involved this part of the body?"      NO  6. CAUSE: "What do you think is causing the hip pain?"      No  7. AGGRAVATING FACTORS: "What makes the  hip pain worse?" (e.g., walking, climbing stairs, running)     Walking  8. OTHER SYMPTOMS: "Do you have any other symptoms?" (e.g., back pain, pain shooting down leg,  fever, rash)     Pain shooting down leg  Protocols used: Hip Pain-A-AH

## 2023-04-27 ENCOUNTER — Ambulatory Visit: Admitting: Family Medicine

## 2023-04-27 ENCOUNTER — Other Ambulatory Visit: Payer: Self-pay | Admitting: Family Medicine

## 2023-04-27 DIAGNOSIS — M25552 Pain in left hip: Secondary | ICD-10-CM

## 2023-04-27 MED ORDER — PREDNISONE 10 MG PO TABS: ORAL_TABLET | ORAL | 0 refills | Status: AC

## 2023-04-27 NOTE — Progress Notes (Signed)
 Discussed about left hip pain at last appointment. Awaiting results of x-ray. Patient has changed his mind and would like to take steroid medication for his pain until his x-ray is back.

## 2023-05-04 ENCOUNTER — Telehealth: Payer: Self-pay

## 2023-05-04 ENCOUNTER — Encounter: Payer: Self-pay | Admitting: Family Medicine

## 2023-05-04 DIAGNOSIS — M1990 Unspecified osteoarthritis, unspecified site: Secondary | ICD-10-CM

## 2023-05-04 MED ORDER — DICLOFENAC SODIUM 75 MG PO TBEC: 75.0000 mg | DELAYED_RELEASE_TABLET | Freq: Two times a day (BID) | ORAL | 0 refills | Status: DC

## 2023-05-04 NOTE — Telephone Encounter (Signed)
 Pharmacy Patient Advocate Encounter   Received notification from CoverMyMeds that prior authorization for  Diclofenac  Sodium 75MG  dr tablets is required/requested.   Insurance verification completed.   The patient is insured through [facility] .   Per test claim: PA required; PA submitted to above mentioned insurance via CoverMyMeds Key/confirmation #/EOC [id number] Status is pending

## 2023-05-04 NOTE — Telephone Encounter (Signed)
 Copied from CRM [id number]. Topic: Clinical - Lab/Test Results >> May 01, 2023 12:51 PM Kita Perish H wrote: Reason for CRM: Patient is inquiring about his Xray results from 4/10, please reach out to patient, thanks.  Arihaan [phone]

## 2023-05-04 NOTE — Telephone Encounter (Signed)
Results sent to pt

## 2023-05-12 MED ORDER — CELECOXIB 100 MG PO CAPS: 100.0000 mg | ORAL_CAPSULE | Freq: Two times a day (BID) | ORAL | 1 refills | Status: DC

## 2023-05-12 NOTE — Addendum Note (Signed)
 Addended by: Teodoro Feller B on: 05/12/2023 03:41 PM   Modules accepted: Orders

## 2023-05-12 NOTE — Telephone Encounter (Signed)
 Pharmacy Patient Advocate Encounter  Received notification from [facility] that Prior Authorization for Diclofenac  Sodium 75MG  dr tablet has been DENIED.  Full denial letter will be uploaded to the media tab. See denial reason below.  Here are the policy requirements your request did not meet:  One of the following:  (1) You have failed one preferred drug as confirmed by claims history or submission of medical records. The preferred drugs: celecoxib capsule (generic for Celebrex), indomethacin immediate release capsule (generic for Indocin), ketorolac  tablet (generic for Toradol ), meloxicam tablet (generic for Mobic), naproxen enteric coated / delayed release tablet (generic for Naprosyn EC), naproxen tablet (generic for Naprosyn), naproxen sodium tablet (generic for Anaprox), sulindac tablet (generic for Clinoril). (2) You cannot use one prfeerred drug (please specify contraindication or intolerance).  PA #/Case ID/Reference #: [id number]

## 2023-06-04 IMAGING — DX DG ABDOMEN 1V
3 series · 3 of 3 positions shown · non-contrast
Comparison: None.

CLINICAL DATA: Constipation

EXAM:
ABDOMEN - 1 VIEW

[abdomen kub (1 of 3)]
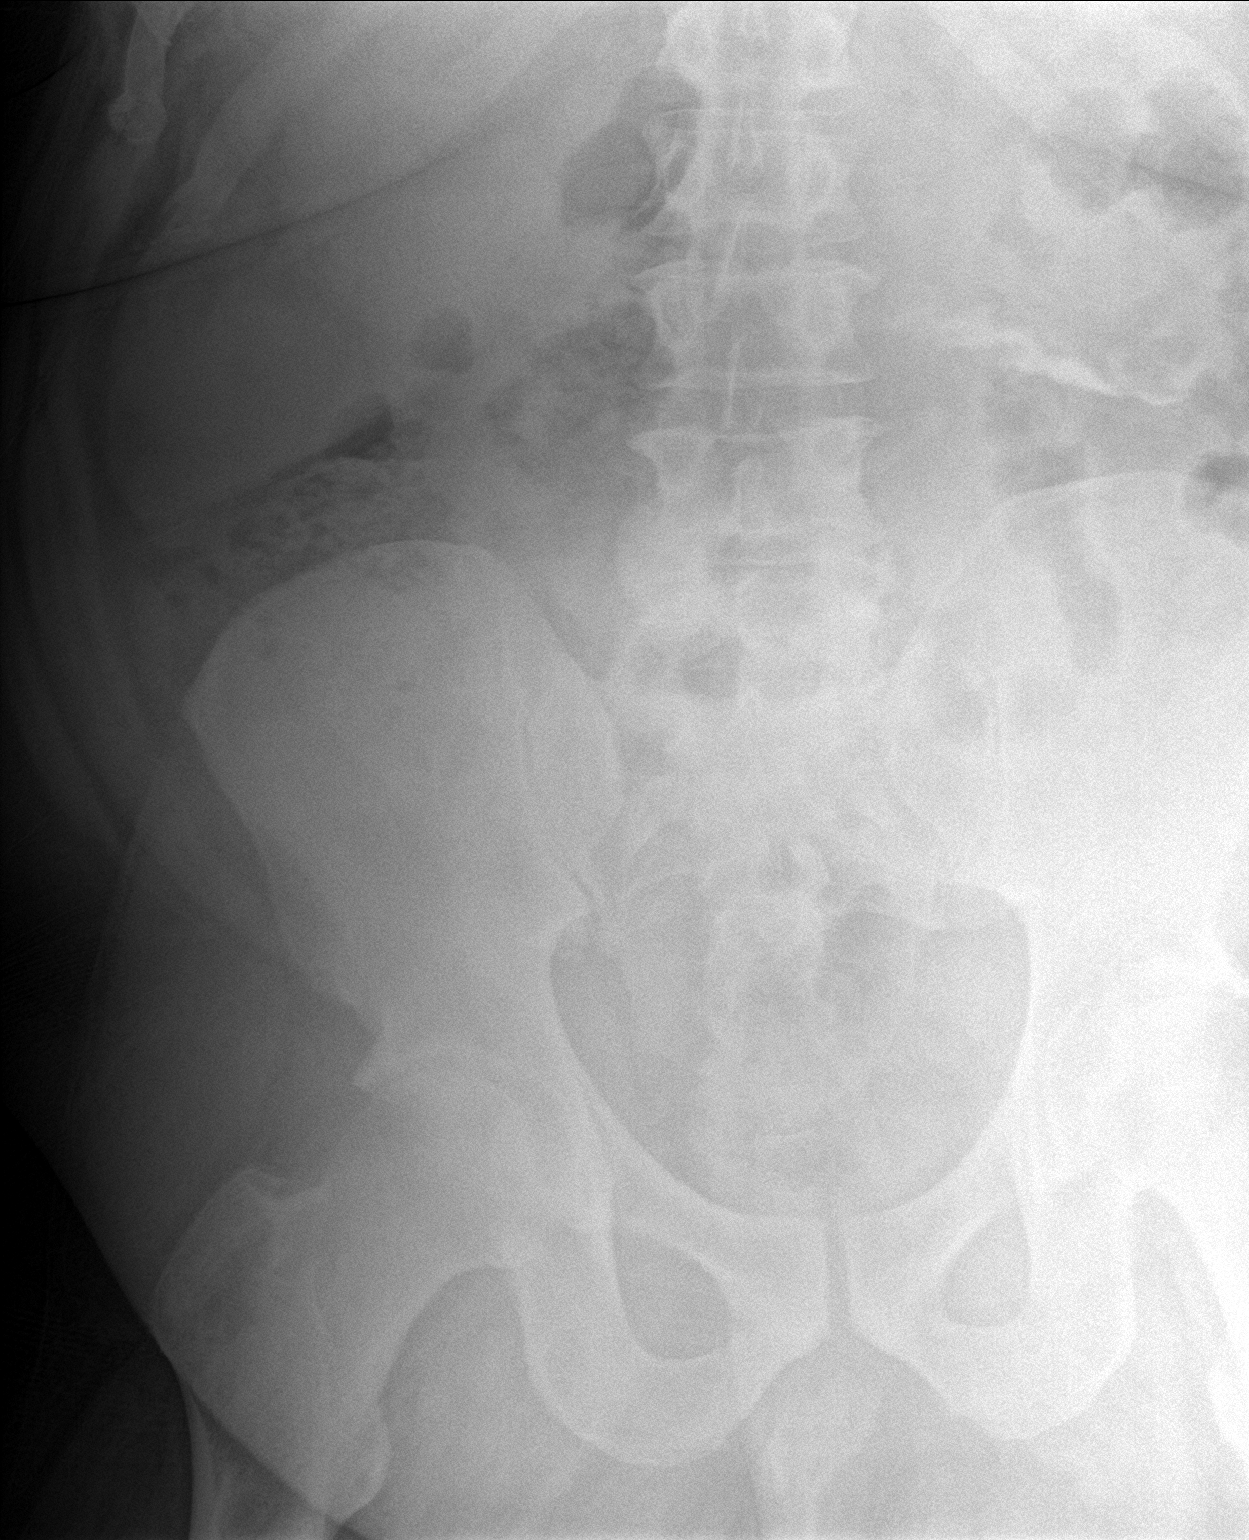

[abdomen kub (2 of 3)]
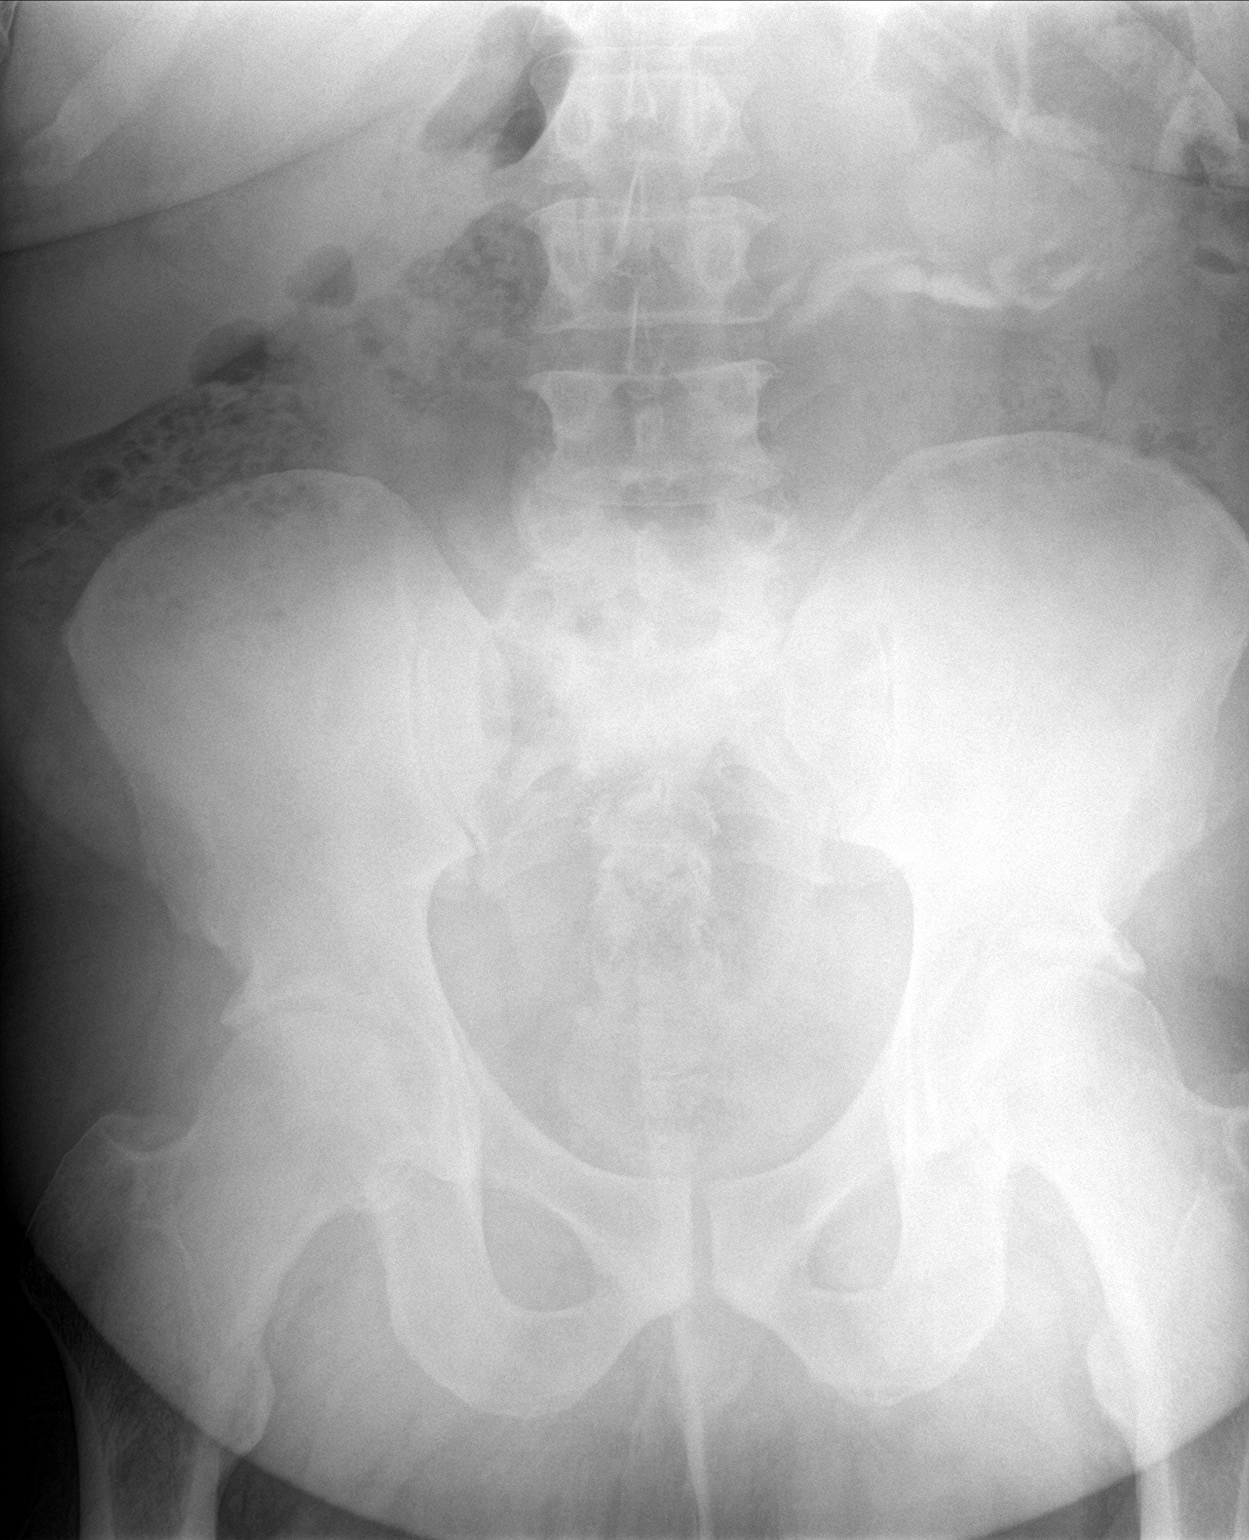

[abdomen kub (3 of 3)]
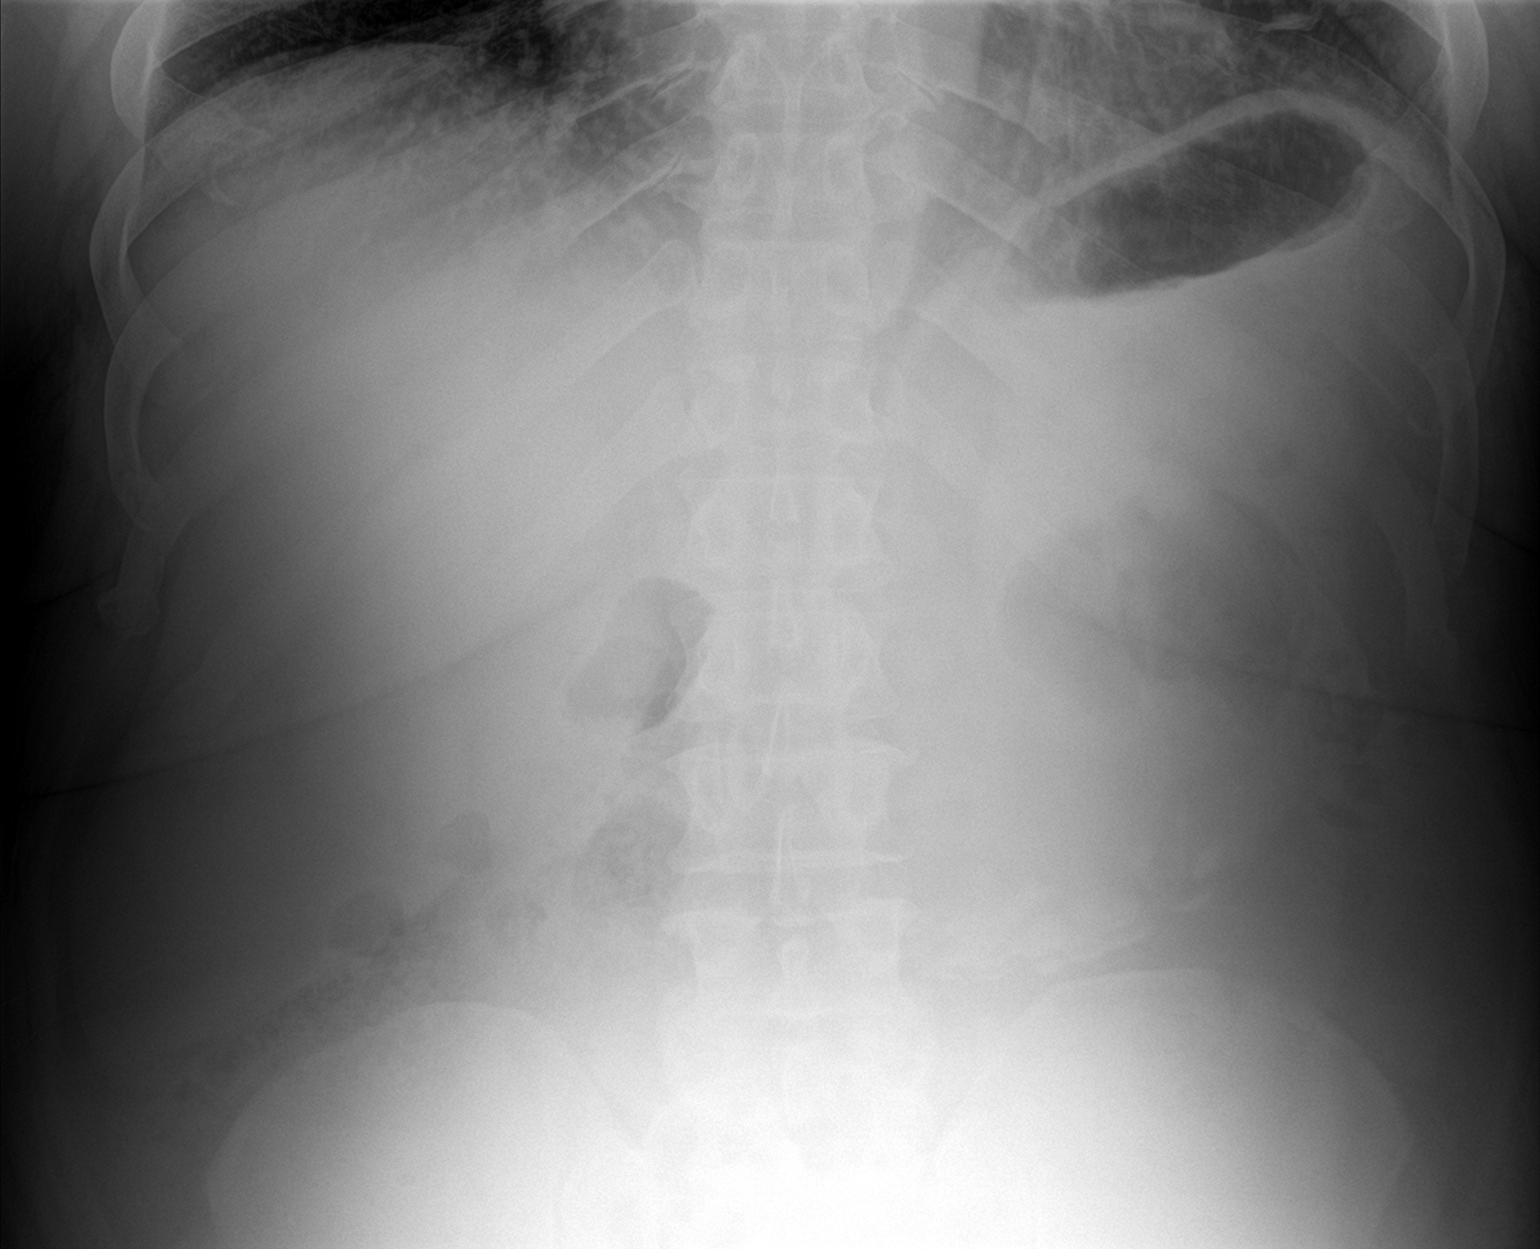

[3 of 3 positions shown; findings below may reference images not displayed]

FINDINGS: Moderate amount of stool throughout the colon. No focal
consolidation. No pleural effusion or pneumothorax. Heart and
mediastinal contours are unremarkable.

No acute osseous abnormality.
IMPRESSION: Moderate amount of stool throughout the colon.

## 2023-10-16 ENCOUNTER — Ambulatory Visit: Admitting: Family Medicine

## 2023-10-19 ENCOUNTER — Ambulatory Visit: Admitting: Family Medicine

## 2023-10-22 ENCOUNTER — Ambulatory Visit: Admitting: Family Medicine

## 2023-11-02 ENCOUNTER — Ambulatory Visit: Admitting: Family Medicine

## 2023-11-12 ENCOUNTER — Ambulatory Visit: Admitting: Family Medicine

## 2023-11-13 ENCOUNTER — Telehealth: Payer: Self-pay | Admitting: Family Medicine

## 2023-11-13 NOTE — Telephone Encounter (Signed)
 Pt has no showed/late canceled 4 appointments since 02/02/23 which qualifies for dismissal. Please advise, thanks!

## 2023-11-19 ENCOUNTER — Ambulatory Visit (INDEPENDENT_AMBULATORY_CARE_PROVIDER_SITE_OTHER): Admitting: Family Medicine

## 2023-11-19 ENCOUNTER — Encounter: Payer: Self-pay | Admitting: Family Medicine

## 2023-11-19 VITALS — BP 128/78 | HR 68 | Temp 98.0°F | Ht 74.0 in | Wt 329.0 lb

## 2023-11-19 DIAGNOSIS — E785 Hyperlipidemia, unspecified: Secondary | ICD-10-CM

## 2023-11-19 DIAGNOSIS — Z0000 Encounter for general adult medical examination without abnormal findings: Secondary | ICD-10-CM

## 2023-11-19 DIAGNOSIS — R195 Other fecal abnormalities: Secondary | ICD-10-CM | POA: Diagnosis not present

## 2023-11-19 DIAGNOSIS — I10 Essential (primary) hypertension: Secondary | ICD-10-CM

## 2023-11-19 DIAGNOSIS — Z131 Encounter for screening for diabetes mellitus: Secondary | ICD-10-CM | POA: Diagnosis not present

## 2023-11-19 LAB — COMPREHENSIVE METABOLIC PANEL WITH GFR
ALT: 21 U/L (ref 0–53)
AST: 19 U/L (ref 0–37)
Albumin: 4.5 g/dL (ref 3.5–5.2)
Alkaline Phosphatase: 73 U/L (ref 39–117)
BUN: 9 mg/dL (ref 6–23)
CO2: 29 meq/L (ref 19–32)
Calcium: 9.1 mg/dL (ref 8.4–10.5)
Chloride: 103 meq/L (ref 96–112)
Creatinine, Ser: 0.81 mg/dL (ref 0.40–1.50)
GFR: 101.69 mL/min (ref >60.00)
Glucose, Bld: 98 mg/dL (ref 70–99)
Potassium: 3.8 meq/L (ref 3.5–5.1)
Sodium: 139 meq/L (ref 135–145)
Total Bilirubin: 0.7 mg/dL (ref 0.2–1.2)
Total Protein: 7.4 g/dL (ref 6.0–8.3)

## 2023-11-19 LAB — LIPID PANEL
Cholesterol: 121 mg/dL (ref 0–200)
HDL: 27.8 mg/dL — ABNORMAL LOW (ref >39.00)
LDL Cholesterol: 65 mg/dL (ref 0–99)
NonHDL: 92.81
Total CHOL/HDL Ratio: 4
Triglycerides: 138 mg/dL (ref 0.0–149.0)
VLDL: 27.6 mg/dL (ref 0.0–40.0)

## 2023-11-19 LAB — CBC WITH DIFFERENTIAL/PLATELET
Basophils Absolute: 0 K/uL (ref 0.0–0.1)
Basophils Relative: 0.3 % (ref 0.0–3.0)
Eosinophils Absolute: 0.1 K/uL (ref 0.0–0.7)
Eosinophils Relative: 0.9 % (ref 0.0–5.0)
HCT: 42.3 % (ref 39.0–52.0)
Hemoglobin: 13.6 g/dL (ref 13.0–17.0)
Lymphocytes Relative: 33.1 % (ref 12.0–46.0)
Lymphs Abs: 2.9 K/uL (ref 0.7–4.0)
MCHC: 32.3 g/dL (ref 30.0–36.0)
MCV: 85.8 fl (ref 78.0–100.0)
Monocytes Absolute: 0.6 K/uL (ref 0.1–1.0)
Monocytes Relative: 6.5 % (ref 3.0–12.0)
Neutro Abs: 5.1 K/uL (ref 1.4–7.7)
Neutrophils Relative %: 59.2 % (ref 43.0–77.0)
Platelets: 202 K/uL (ref 150.0–400.0)
RBC: 4.93 Mil/uL (ref 4.22–5.81)
RDW: 13.6 % (ref 11.5–15.5)
WBC: 8.7 K/uL (ref 4.0–10.5)

## 2023-11-19 LAB — HEMOGLOBIN A1C: Hgb A1c MFr Bld: 5.1 % (ref 4.6–6.5)

## 2023-11-19 LAB — TSH: TSH: 1.48 u[IU]/mL (ref 0.35–5.50)

## 2023-11-19 MED ORDER — OLMESARTAN MEDOXOMIL 40 MG PO TABS: 40.0000 mg | ORAL_TABLET | Freq: Every day | ORAL | 1 refills | Status: AC

## 2023-11-19 MED ORDER — AMLODIPINE BESYLATE 5 MG PO TABS: 5.0000 mg | ORAL_TABLET | Freq: Every day | ORAL | 1 refills | Status: AC

## 2023-11-19 NOTE — Patient Instructions (Addendum)
-  It was nice to see you today.  -Physical exam completed today.  -Recommend to obtain tetanus vaccine, Hep B vaccine, pneumonia, and shingles vaccine at your local pharmacy.  -Continue all medications. Advised to take Atorvastatin in the morning instead of missing medication at night.  -Continue to work on a healthy diet and participating in regular exercise.  -Ordered labs. Office will call with lab results and will be available via MyChart.  -Follow up in 3 months for chronic management and please be fasting.

## 2023-11-19 NOTE — Progress Notes (Signed)
 Complete physical exam  Patient: Marcus Suarez   DOB: April 01, 1970   53 y.o. Male  MRN: [id number]  Subjective:    Chief Complaint  Patient presents with   Annual Exam    Marcus Suarez is a 53 y.o. male who presents today for a complete physical exam. He reports consuming a low sodium, less carbohydrate diet. Exercise: Not exercising He generally feels well. He reports sleeping well. He does not have additional problems to discuss today.    Most recent fall risk assessment:    11/19/2023   11:13 AM  Fall Risk   Falls in the past year? 0  Number falls in past yr: 0  Injury with Fall? 0  Risk for fall due to : No Fall Risks  Follow up Falls evaluation completed     Most recent depression screenings:    11/19/2023   11:13 AM 04/02/2023    3:45 PM  PHQ 2/9 Scores  PHQ - 2 Score 0 1  PHQ- 9 Score 0 1      Data saved with a previous flowsheet row definition    Vision:Not within last year  and Dental: No current dental problems and No regular dental care   Past Medical History:  Diagnosis Date   Arthritis    Chicken pox    High blood pressure    Past Surgical History:  Procedure Laterality Date   KNEE ARTHROSCOPY Left 2009   LAPAROSCOPIC APPENDECTOMY N/A 11/21/2020   Procedure: APPENDECTOMY LAPAROSCOPIC;  Surgeon: Vernetta Berg, MD;  Location: [facility];  Service: General;  Laterality: N/A;   LEG SURGERY Left 2005   calf surgery, tumor removed   Social History   Tobacco Use   Smoking status: Former    Current packs/day: 0.00    Types: Cigarettes    Quit date: 1996    Years since quitting: 29.8   Smokeless tobacco: Never   Tobacco comments:    Pt reports he smoked cigarettes for 3 years.   Vaping Use   Vaping status: Never Used  Substance Use Topics   Alcohol use: Not Currently   Drug use: Yes    Types: Marijuana    Comment: Daily   Social History   Socioeconomic History   Marital status: Married    Spouse name: Not on file   Number of  children: 5   Years of education: Not on file   Highest education level: High school graduate  Occupational History   Not on file  Tobacco Use   Smoking status: Former    Current packs/day: 0.00    Types: Cigarettes    Quit date: 1996    Years since quitting: 29.8   Smokeless tobacco: Never   Tobacco comments:    Pt reports he smoked cigarettes for 3 years.   Vaping Use   Vaping status: Never Used  Substance and Sexual Activity   Alcohol use: Not Currently   Drug use: Yes    Types: Marijuana    Comment: Daily   Sexual activity: Yes    Birth control/protection: None  Other Topics Concern   Not on file  Social History Narrative   Not on file   Social Drivers of Health   Financial Resource Strain: Low Risk  (11/14/2022)   Overall Financial Resource Strain (CARDIA)    Difficulty of Paying Living Expenses: Not hard at all  Food Insecurity: No Food Insecurity (11/14/2022)   Hunger Vital Sign    Worried About Running Out  of Food in the Last Year: Never true    Ran Out of Food in the Last Year: Never true  Transportation Needs: No Transportation Needs (11/14/2022)   PRAPARE - Administrator, Civil Service (Medical): No    Lack of Transportation (Non-Medical): No  Physical Activity: Inactive (11/14/2022)   Exercise Vital Sign    Days of Exercise per Week: 0 days    Minutes of Exercise per Session: 0 min  Stress: No Stress Concern Present (11/14/2022)   [employer] of Occupational Health - Occupational Stress Questionnaire    Feeling of Stress : Not at all  Social Connections: Moderately Isolated (11/14/2022)   Social Connection and Isolation Panel    Frequency of Communication with Friends and Family: More than three times a week    Frequency of Social Gatherings with Friends and Family: More than three times a week    Attends Religious Services: Never    Database Administrator or Organizations: No    Attends Banker Meetings: Never    Marital  Status: Married  Catering Manager Violence: Not At Risk (11/14/2022)   Humiliation, Afraid, Rape, and Kick questionnaire    Fear of Current or Ex-Partner: No    Emotionally Abused: No    Physically Abused: No    Sexually Abused: No   Family Status  Relation Name Status   Mother  Alive   Father  Alive   Daughter  Alive   Son  Alive   Son  Alive   Son  Alive   Son  Alive   Son  Alive   MGM  Deceased   MGF  Deceased   PGM  Deceased   PGF  Deceased   H Sister  Alive   H Sister  Alive   H Sister  Alive   H Sister  Alive   H Brother  Alive   H Brother  Alive   H Brother  Alive  No partnership data on file   Family History  Problem Relation Age of Onset   Osteoarthritis Father    Heart failure Daughter    Alcohol abuse Maternal Grandfather    Heart Problems Paternal Grandfather    No Known Allergies   Patient Care Team: Billy Philippe SAUNDERS, NP as PCP - General (Family Medicine)   Outpatient Medications Prior to Visit  Medication Sig   simvastatin  (ZOCOR ) 20 MG tablet Take 1 tablet (20 mg total) by mouth at bedtime.   [DISCONTINUED] amLODipine  (NORVASC ) 5 MG tablet Take 1 tablet (5 mg total) by mouth daily.   [DISCONTINUED] olmesartan  (BENICAR ) 40 MG tablet TAKE 1 TABLET(40 MG) BY MOUTH DAILY   [DISCONTINUED] celecoxib  (CELEBREX ) 100 MG capsule Take 1 capsule (100 mg total) by mouth every 12 (twelve) hours.   [DISCONTINUED] diclofenac  (VOLTAREN ) 75 MG EC tablet Take 1 tablet (75 mg total) by mouth 2 (two) times daily.   [DISCONTINUED] tiZANidine  (ZANAFLEX ) 4 MG tablet Take 1 tablet (4 mg total) by mouth every 8 (eight) hours as needed for muscle spasms.   No facility-administered medications prior to visit.   Review of Systems  Musculoskeletal:  Positive for joint pain (Hip pain; knee pain).  See HPI above    Objective:  BP 128/78   Pulse 68   Temp 98 F (36.7 C) (Oral)   Ht 6' 2 (1.88 m)   Wt (!) 329 lb (149.2 kg)   SpO2 98%   BMI 42.24 kg/m    Physical  Exam Vitals reviewed.  Constitutional:      General: He is not in acute distress.    Appearance: Normal appearance. He is obese. He is not ill-appearing, toxic-appearing or diaphoretic.  HENT:     Head: Normocephalic and atraumatic.     Right Ear: Tympanic membrane, ear canal and external ear normal. There is no impacted cerumen.     Left Ear: Tympanic membrane, ear canal and external ear normal. There is no impacted cerumen.     Mouth/Throat:     Mouth: Mucous membranes are moist.     Pharynx: Oropharynx is clear. No oropharyngeal exudate or posterior oropharyngeal erythema.  Eyes:     General:        Right eye: No discharge.        Left eye: No discharge.     Conjunctiva/sclera: Conjunctivae normal.     Pupils: Pupils are equal, round, and reactive to light.  Neck:     Thyroid : No thyromegaly.  Cardiovascular:     Rate and Rhythm: Normal rate and regular rhythm.     Pulses:          Posterior tibial pulses are 2+ on the right side and 2+ on the left side.     Heart sounds: Normal heart sounds. No murmur heard.    No friction rub. No gallop.  Pulmonary:     Effort: Pulmonary effort is normal. No respiratory distress.     Breath sounds: Normal breath sounds.  Abdominal:     General: Abdomen is flat. Bowel sounds are normal. There is no distension.     Palpations: Abdomen is soft. There is no mass.     Tenderness: There is no abdominal tenderness.  Musculoskeletal:        General: Normal range of motion.     Cervical back: Normal range of motion.     Right lower leg: No edema.     Left lower leg: No edema.  Lymphadenopathy:     Cervical: No cervical adenopathy.  Skin:    General: Skin is warm and dry.  Neurological:     General: No focal deficit present.     Mental Status: He is alert and oriented to person, place, and time. Mental status is at baseline.     Motor: No weakness.     Gait: Gait normal.  Psychiatric:        Mood and Affect: Mood normal.        Behavior:  Behavior normal.        Thought Content: Thought content normal.        Judgment: Judgment normal.        Assessment & Plan:    Routine Health Maintenance and Physical Exam  Immunization History  Administered Date(s) Administered   Unspecified SARS-COV-2 Vaccination 08/13/2019, 09/04/2019    Health Maintenance  Topic Date Due   DTaP/Tdap/Td (1 - Tdap) Never done   Hepatitis B Vaccines 19-59 Average Risk (1 of 3 - 19+ 3-dose series) Never done   Colonoscopy  Never done   Pneumococcal Vaccine: 50+ Years (1 of 1 - PCV) Never done   Zoster Vaccines- Shingrix (1 of 2) Never done   Influenza Vaccine  Never done   COVID-19 Vaccine (3 - 2025-26 season) 09/14/2023   Hepatitis C Screening  Completed   HIV Screening  Completed   HPV VACCINES  Aged Out   Meningococcal B Vaccine  Aged Out    Discussed health benefits of physical activity, and  encouraged him to engage in regular exercise appropriate for his age and condition.  Annual physical exam -     CBC with Differential/Platelet -     Comprehensive metabolic panel with GFR -     TSH  Positive colorectal cancer screening using Cologuard test -     Ambulatory referral to Gastroenterology  Morbid obesity (HCC) -     CBC with Differential/Platelet -     Comprehensive metabolic panel with GFR -     Hemoglobin A1c -     Lipid panel -     TSH  Hyperlipidemia, unspecified hyperlipidemia type -     Comprehensive metabolic panel with GFR -     Lipid panel  Primary hypertension -     Comprehensive metabolic panel with GFR -     amLODIPine  Besylate; Take 1 tablet (5 mg total) by mouth daily.  Dispense: 90 tablet; Refill: 1 -     Olmesartan  Medoxomil; Take 1 tablet (40 mg total) by mouth daily.  Dispense: 90 tablet; Refill: 1  1.Review health maintenance:  -Covid vaccine: Declines -Tdap vaccine: May obtain at local pharmacy -Influenza vaccine: Declines  -Hep B vaccine: May obtain at local pharmacy  -PNA/Zoster vaccine: May  obtain at local pharmacy  -Cologuard positive: Referral placed again for GI  2.Physical exam completed. 3.Continue all medications. Patient reports he has been missing his Simvastatin  at nighttime. Advised to take in the morning instead of missing medication at night.  4. Continue to work on a healthy diet and participating in regular exercise.  5.Ordered labs. Office will call with lab results and will be available via MyChart.  6. Follow up in 3 months for chronic management.  Return in about 3 months (around 02/19/2024) for chronic management-fasting .   Lora Glomski, NP

## 2023-11-20 ENCOUNTER — Ambulatory Visit: Payer: Self-pay | Admitting: Family Medicine

## 2023-11-30 ENCOUNTER — Ambulatory Visit: Admitting: Family Medicine
